# Patient Record
Sex: Female | Born: 1980 | Hispanic: Yes | Marital: Married | Smoking: Former smoker
Health system: Southern US, Community
[De-identification: ages and names within clinical notes are randomized; demographics above are authoritative.]

## PROBLEM LIST (undated history)

## (undated) ENCOUNTER — Inpatient Hospital Stay (HOSPITAL_COMMUNITY): Payer: Self-pay

## (undated) DIAGNOSIS — N96 Recurrent pregnancy loss: Secondary | ICD-10-CM

## (undated) DIAGNOSIS — K760 Fatty (change of) liver, not elsewhere classified: Secondary | ICD-10-CM

## (undated) HISTORY — PX: WISDOM TOOTH EXTRACTION: SHX21

## (undated) HISTORY — PX: DILATION AND CURETTAGE OF UTERUS: SHX78

## (undated) HISTORY — PX: FACIAL FRACTURE SURGERY: SHX1570

---

## 2009-08-24 ENCOUNTER — Ambulatory Visit (HOSPITAL_COMMUNITY): Admission: RE | Admit: 2009-08-24 | Discharge: 2009-08-24 | Payer: Self-pay | Admitting: Family Medicine

## 2010-09-19 ENCOUNTER — Inpatient Hospital Stay (INDEPENDENT_AMBULATORY_CARE_PROVIDER_SITE_OTHER)
Admission: RE | Admit: 2010-09-19 | Discharge: 2010-09-19 | Disposition: A | Discharge status: Home / Self Care | Payer: Self-pay | Source: Ambulatory Visit | Attending: Family Medicine | Admitting: Family Medicine

## 2010-09-19 DIAGNOSIS — O039 Complete or unspecified spontaneous abortion without complication: Secondary | ICD-10-CM

## 2010-09-19 LAB — CBC
MCHC: 34.9 g/dL (ref 30.0–36.0)
Platelets: 270 10*3/uL (ref 150–400)
RDW: 12.4 % (ref 11.5–15.5)

## 2010-09-19 LAB — POCT PREGNANCY, URINE: Preg Test, Ur: POSITIVE

## 2010-09-20 LAB — GC/CHLAMYDIA PROBE AMP, GENITAL
Chlamydia, DNA Probe: NEGATIVE
GC Probe Amp, Genital: NEGATIVE

## 2010-10-06 ENCOUNTER — Ambulatory Visit (INDEPENDENT_AMBULATORY_CARE_PROVIDER_SITE_OTHER): Payer: Self-pay | Admitting: Obstetrics and Gynecology

## 2010-10-06 DIAGNOSIS — O039 Complete or unspecified spontaneous abortion without complication: Secondary | ICD-10-CM

## 2010-10-07 NOTE — Group Therapy Note (Signed)
NAMEIA, LEEB       ACCOUNT NO.:  [id number]  MEDICAL RECORD NO.:  [id number]           PATIENT TYPE:  A  LOCATION:  [facility]                   FACILITY:  [facility]  PHYSICIAN:  Argentina Donovan, MD        DATE OF BIRTH:  1980-09-09  DATE OF SERVICE:  10/06/2010                                 CLINIC NOTE  The patient is a 30 year old gravida 4, para 0-0-4-0 who had 2 abortions in [place] 1 late term at 5 months and 2 spontaneous miscarriages since she has been here.  She recently had a miscarriage, went into the maternity admissions.  They drew a quantitative beta which was 1488 and told her that bleeding would stop soon.  There was no further follow up. She is in today.  We are going to repeat that.  She had a Pap smear in December 2011 and she has not used any contraception for many years because she thought she could not get pregnant.  She is not interested in pregnancy right now.  We have discussed with her by the interpreter on the telephone that the different options.  She has decided to and want to fill out the papers for an IUD.  I told her what we will do is we will run the blood test and we filled out the papers.  We will call her with results of the blood test and if it is not back down to 0, she will call in for repeat.  If it is going up, we will get an ultrasound. If it is completely down, we will bring her in for a Depo-Provera to cover her until we get news about to her IUD.  If she get it from the factory, she will still have to pay for the insertion and she understands that.  IMPRESSION:  Probable complete abortion.          ______________________________ Argentina Donovan, MD    PR/MEDQ  D:  10/06/2010  T:  10/07/2010  Job:  [id number]

## 2010-10-14 ENCOUNTER — Other Ambulatory Visit: Payer: Self-pay

## 2010-10-14 DIAGNOSIS — Z0189 Encounter for other specified special examinations: Secondary | ICD-10-CM

## 2010-10-15 ENCOUNTER — Other Ambulatory Visit: Payer: Self-pay

## 2010-10-21 ENCOUNTER — Other Ambulatory Visit: Payer: Self-pay

## 2010-10-29 ENCOUNTER — Other Ambulatory Visit: Payer: Self-pay

## 2010-10-29 DIAGNOSIS — Z0189 Encounter for other specified special examinations: Secondary | ICD-10-CM

## 2011-11-13 ENCOUNTER — Emergency Department (HOSPITAL_COMMUNITY): Payer: Self-pay

## 2011-11-13 ENCOUNTER — Emergency Department (HOSPITAL_COMMUNITY)
Admission: EM | Admit: 2011-11-13 | Discharge: 2011-11-13 | Disposition: A | Discharge status: Home / Self Care | Payer: Self-pay | Attending: Emergency Medicine | Admitting: Emergency Medicine

## 2011-11-13 ENCOUNTER — Encounter (HOSPITAL_COMMUNITY): Payer: Self-pay | Admitting: *Deleted

## 2011-11-13 DIAGNOSIS — W3400XA Accidental discharge from unspecified firearms or gun, initial encounter: Secondary | ICD-10-CM

## 2011-11-13 DIAGNOSIS — S01409A Unspecified open wound of unspecified cheek and temporomandibular area, initial encounter: Secondary | ICD-10-CM | POA: Insufficient documentation

## 2011-11-13 DIAGNOSIS — Z1889 Other specified retained foreign body fragments: Secondary | ICD-10-CM | POA: Insufficient documentation

## 2011-11-13 DIAGNOSIS — S1190XA Unspecified open wound of unspecified part of neck, initial encounter: Secondary | ICD-10-CM | POA: Insufficient documentation

## 2011-11-13 DIAGNOSIS — S41009A Unspecified open wound of unspecified shoulder, initial encounter: Secondary | ICD-10-CM | POA: Insufficient documentation

## 2011-11-13 LAB — BASIC METABOLIC PANEL
CO2: 22 mEq/L (ref 19–32)
Chloride: 102 mEq/L (ref 96–112)
Creatinine, Ser: 0.55 mg/dL (ref 0.50–1.10)
GFR calc Af Amer: >90 mL/min (ref >90)
GFR calc non Af Amer: >90 mL/min (ref >90)
Potassium: 3.1 mEq/L — ABNORMAL LOW (ref 3.5–5.1)

## 2011-11-13 LAB — DIFFERENTIAL
Lymphocytes Relative: 36 % (ref 12–46)
Monocytes Absolute: 0.6 10*3/uL (ref 0.1–1.0)
Monocytes Relative: 6 % (ref 3–12)
Neutro Abs: 5.4 10*3/uL (ref 1.7–7.7)
Neutrophils Relative %: 55 % (ref 43–77)

## 2011-11-13 LAB — CBC
Hemoglobin: 12.5 g/dL (ref 12.0–15.0)
MCHC: 35 g/dL (ref 30.0–36.0)
MCV: 91.1 fL (ref 78.0–100.0)
Platelets: 205 10*3/uL (ref 150–400)
WBC: 9.8 10*3/uL (ref 4.0–10.5)

## 2011-11-13 LAB — ETHANOL: Alcohol, Ethyl (B): 106 mg/dL — ABNORMAL HIGH (ref 0–11)

## 2011-11-13 MED ORDER — FENTANYL CITRATE 0.05 MG/ML IJ SOLN
100.0000 ug | Freq: Once | INTRAMUSCULAR | Status: AC
  Administered 2011-11-13: 100 ug via INTRAVENOUS
  Filled 2011-11-13: qty 2

## 2011-11-13 MED ORDER — OXYCODONE-ACETAMINOPHEN 5-325 MG PO TABS: 2.0000 | ORAL_TABLET | ORAL | Status: AC | PRN

## 2011-11-13 MED ORDER — LIDOCAINE-EPINEPHRINE (PF) 2 %-1:200000 IJ SOLN
INTRAMUSCULAR | Status: AC
  Administered 2011-11-13: 05:00:00
  Filled 2011-11-13: qty 20

## 2011-11-13 MED ORDER — CEPHALEXIN 500 MG PO CAPS: 500.0000 mg | ORAL_CAPSULE | Freq: Four times a day (QID) | ORAL | Status: AC

## 2011-11-13 MED ORDER — TETANUS-DIPHTH-ACELL PERTUSSIS 5-2.5-18.5 LF-MCG/0.5 IM SUSP
0.5000 mL | Freq: Once | INTRAMUSCULAR | Status: AC
  Administered 2011-11-13: 0.5 mL via INTRAMUSCULAR
  Filled 2011-11-13: qty 0.5
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
## 2011-11-13 NOTE — ED Notes (Signed)
Pt attempting to call someone to come pick her up.

## 2011-11-13 NOTE — ED Notes (Signed)
Warm blanket given x 2 due to patient shivering. Patient nodded her head yes when asked if she was cold.
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
## 2011-11-13 NOTE — ED Notes (Signed)
Patient back from CT.
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
## 2011-11-15 ENCOUNTER — Encounter (HOSPITAL_COMMUNITY): Payer: Self-pay | Admitting: *Deleted

## 2011-11-15 ENCOUNTER — Emergency Department (HOSPITAL_COMMUNITY)
Admission: EM | Admit: 2011-11-15 | Discharge: 2011-11-15 | Disposition: A | Discharge status: Home / Self Care | Payer: Self-pay | Attending: Emergency Medicine | Admitting: Emergency Medicine

## 2011-11-15 ENCOUNTER — Telehealth: Payer: Self-pay | Admitting: Orthopedic Surgery

## 2011-11-15 DIAGNOSIS — Z5189 Encounter for other specified aftercare: Secondary | ICD-10-CM | POA: Insufficient documentation

## 2011-11-15 DIAGNOSIS — Z91012 Allergy to eggs: Secondary | ICD-10-CM | POA: Insufficient documentation

## 2011-11-15 DIAGNOSIS — Z91018 Allergy to other foods: Secondary | ICD-10-CM | POA: Insufficient documentation
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
## 2011-11-15 NOTE — Discharge Instructions (Signed)
Your wounds seem to be progressing nicely. Please see MD at the [facility] in [place] for recheck and follow up. Thanks.Infeccin de heridas  (Wound Infection)  La infeccin ocurre cuando algn tipo de germenbacteriase desarrolla en una herida. Al tratar la infeccin se ayuda a curar la herida. Este tipo de infeccin Insurance underwriter.  CUIDADOS EN EL HOGAR   Slo tome la medicacin segn las indicaciones.   Tome los antibiticos tal como se le indic. Finalice la prescripcin completa, aunque se sienta mejor.   Limpie la herida con jabn suave y [place], segn las indicaciones. Enjuague bien el jabn. Seque bien el rea con una toalla limpia dando golpecitos. No la frote.   Cambie eldressings) tal como se le indic.   Aplique una crema segn le indique el mdico.   Si el vendaje se pega, mjelo con agua jabonosa para despegarlo.   Cmbielo si se moja, se ensucia o huele mal.   Puede tomar [place]. No debe tomar baos de inmersin, nadar ni hacer nada que haga que la herida se encuentre debajo del agua.   Debe evitar los ejercicios fsicos que lo hagan transpirar.   Si la herida le pica, use un medicamento que calme la picazn. No se toque ni se rasque la herida.   Cumpla con los controles mdicos segn las indicaciones.  SOLICITE AYUDA DE INMEDIATO SI:   Aumenta la inflamacin (hinchazn) o el enrojecimiento alrededor de la herida.   Observa una secrecin de color blanco amarillento (pus).   La herida tiene mal olor.   Se abre ms.   Tiene fiebre.  ASEGRESE DE QUE:   Comprende estas instrucciones.   Controlar su enfermedad.   Solicitar ayuda de inmediato si no mejora o si empeora.  Document Released: 01/05/2011 Document Revised: 04/28/2011 [facility] Patient Information 2012 [place], [place].

## 2011-11-15 NOTE — Telephone Encounter (Signed)
Phone number goes to weird recording saying "This number or code is not correct".
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
## 2011-11-16 NOTE — ED Provider Notes (Signed)
Medical screening examination/treatment/procedure(s) were performed by non-physician practitioner and as supervising physician I was immediately available for consultation/collaboration.   Rees Santistevan L Prisca Gearing, MD 11/16/11 0824 

## 2011-12-14 ENCOUNTER — Encounter: Payer: Self-pay | Admitting: Obstetrics & Gynecology

## 2013-01-24 ENCOUNTER — Inpatient Hospital Stay (HOSPITAL_COMMUNITY): Payer: Self-pay

## 2013-01-24 ENCOUNTER — Inpatient Hospital Stay (HOSPITAL_COMMUNITY)
Admission: AD | Admit: 2013-01-24 | Discharge: 2013-01-24 | Disposition: A | Discharge status: Home / Self Care | Payer: Self-pay | Source: Ambulatory Visit | Attending: Obstetrics & Gynecology | Admitting: Obstetrics & Gynecology

## 2013-01-24 ENCOUNTER — Encounter (HOSPITAL_COMMUNITY): Payer: Self-pay | Admitting: *Deleted

## 2013-01-24 DIAGNOSIS — R1900 Intra-abdominal and pelvic swelling, mass and lump, unspecified site: Secondary | ICD-10-CM | POA: Insufficient documentation

## 2013-01-24 DIAGNOSIS — O4690 Antepartum hemorrhage, unspecified, unspecified trimester: Secondary | ICD-10-CM

## 2013-01-24 DIAGNOSIS — R109 Unspecified abdominal pain: Secondary | ICD-10-CM | POA: Insufficient documentation

## 2013-01-24 DIAGNOSIS — O209 Hemorrhage in early pregnancy, unspecified: Secondary | ICD-10-CM

## 2013-01-24 LAB — CBC
HCT: 35.9 % — ABNORMAL LOW (ref 36.0–46.0)
MCH: 32.4 pg (ref 26.0–34.0)
MCV: 94.5 fL (ref 78.0–100.0)
RBC: 3.8 MIL/uL — ABNORMAL LOW (ref 3.87–5.11)
WBC: 9.1 10*3/uL (ref 4.0–10.5)

## 2013-01-24 LAB — URINE MICROSCOPIC-ADD ON

## 2013-01-24 LAB — URINALYSIS, ROUTINE W REFLEX MICROSCOPIC
Glucose, UA: NEGATIVE mg/dL
Leukocytes, UA: NEGATIVE
Specific Gravity, Urine: >1.03 — ABNORMAL HIGH (ref 1.005–1.030)

## 2013-01-24 LAB — WET PREP, GENITAL
Clue Cells Wet Prep HPF POC: NONE SEEN
Trich, Wet Prep: NONE SEEN
WBC, Wet Prep HPF POC: NONE SEEN
Yeast Wet Prep HPF POC: NONE SEEN

## 2013-01-24 LAB — POCT PREGNANCY, URINE: Preg Test, Ur: POSITIVE — AB

## 2013-01-24 NOTE — MAU Note (Signed)
[employer] translator for triage. Patient states she has had a positive home pregnancy test that was positive. Has been having light bleeding for about 5 days, varies from heavy to light. Heavier since yesterday. Slight abdominal pain and pain in the right leg for about since 7-27.

## 2013-01-24 NOTE — MAU Provider Note (Signed)
History     CSN: [id number]  Arrival date and time: 01/24/13 1717   First Provider Initiated Contact with Patient 01/24/13 1904      Chief Complaint  Patient presents with  . Possible Pregnancy  . Abdominal Pain  . Vaginal Bleeding   HPI [name] is a 32 y.o. G4P0030 at [redacted]w[redacted]d who presents to MAU today with complaint of lower abdominal pain since 01/14/13 and vaginal bleeding since Saturday. The patient states that pain is worsening and is rated at 8/10 now. She is also having occasional dysuria and nausea without vomiting or diarrhea. She denies other discharge, fever or dizziness.   OB History   Grav Para Term Preterm Abortions TAB SAB Ect Mult Living   4    3  3    0      Past Medical History  Diagnosis Date  . Medical history non-contributory     Past Surgical History  Procedure Laterality Date  . No past surgeries      No family history on file.  History  Substance Use Topics  . Smoking status: Former Smoker    Quit date: 05/26/2002  . Smokeless tobacco: Not on file  . Alcohol Use: Yes     Comment: occasional Alchol 1 ever 1-2 weeks    Allergies:  Allergies  Allergen Reactions  . Eggs Or Egg-Derived Products Shortness Of Breath  . Pork-Derived Products Shortness Of Breath    No prescriptions prior to admission    Review of Systems  Constitutional: Negative for fever and malaise/fatigue.  Gastrointestinal: Positive for nausea and abdominal pain. Negative for vomiting, diarrhea and constipation.  Genitourinary: Positive for dysuria. Negative for urgency and frequency.       + vaginal bleeding Neg - vaginal discharge  Neurological: Negative for dizziness.   Physical Exam   Blood pressure 115/63, pulse 60, temperature 98 F (36.7 C), temperature source Oral, resp. rate 18, height 4\' 9"  (1.448 m), weight 149 lb (67.586 kg), last menstrual period 12/16/2012, SpO2 100.00%.  Physical Exam  Constitutional: She is oriented to person, place,  and time. She appears well-developed and well-nourished. No distress.  HENT:  Head: Normocephalic and atraumatic.  Cardiovascular: Normal rate, regular rhythm and normal heart sounds.   Respiratory: Effort normal and breath sounds normal. No respiratory distress.  GI: Soft. Bowel sounds are normal. She exhibits no distension and no mass. There is tenderness (mild tenderness to palpatio of the lower abdomen). There is no rebound and no guarding.  Genitourinary: Uterus is enlarged (exam limited by maternal body habitus). Uterus is not tender. Cervix exhibits no motion tenderness, no discharge and no friability. Right adnexum displays tenderness. Right adnexum displays no mass. Left adnexum displays no mass and no tenderness. There is bleeding (small amount of active bleeding in the vaginal vault and at the cervical os) around the vagina. No vaginal discharge found.  Neurological: She is alert and oriented to person, place, and time.  Skin: Skin is warm and dry. No erythema.  Psychiatric: She has a normal mood and affect.   Results for orders placed during the hospital encounter of 01/24/13 (from the past 24 hour(s))  URINALYSIS, ROUTINE W REFLEX MICROSCOPIC     Status: Abnormal   Collection Time    01/24/13  5:55 PM      Result Value Range   Color, Urine YELLOW  YELLOW   APPearance HAZY (*) CLEAR   Specific Gravity, Urine >1.030 (*) 1.005 - 1.030   pH 6.0  5.0 - 8.0   Glucose, UA NEGATIVE  NEGATIVE mg/dL   Hgb urine dipstick LARGE (*) NEGATIVE   Bilirubin Urine NEGATIVE  NEGATIVE   Ketones, ur NEGATIVE  NEGATIVE mg/dL   Protein, ur NEGATIVE  NEGATIVE mg/dL   Urobilinogen, UA 0.2  0.0 - 1.0 mg/dL   Nitrite NEGATIVE  NEGATIVE   Leukocytes, UA NEGATIVE  NEGATIVE  URINE MICROSCOPIC-ADD ON     Status: Abnormal   Collection Time    01/24/13  5:55 PM      Result Value Range   Squamous Epithelial / LPF RARE  RARE   WBC, UA 0-2  <3 WBC/hpf   RBC / HPF 3-6  <3 RBC/hpf   Bacteria, UA FEW (*)  RARE  POCT PREGNANCY, URINE     Status: Abnormal   Collection Time    01/24/13  5:56 PM      Result Value Range   Preg Test, Ur POSITIVE (*) NEGATIVE  CBC     Status: Abnormal   Collection Time    01/24/13  6:30 PM      Result Value Range   WBC 9.1  4.0 - 10.5 K/uL   RBC 3.80 (*) 3.87 - 5.11 MIL/uL   Hemoglobin 12.3  12.0 - 15.0 g/dL   HCT 16.1 (*) 09.6 - 04.5 %   MCV 94.5  78.0 - 100.0 fL   MCH 32.4  26.0 - 34.0 pg   MCHC 34.3  30.0 - 36.0 g/dL   RDW 40.9  81.1 - 91.4 %   Platelets 215  150 - 400 K/uL  ABO/RH     Status: None   Collection Time    01/24/13  6:30 PM      Result Value Range   ABO/RH(D) O POS    HCG, QUANTITATIVE, PREGNANCY     Status: Abnormal   Collection Time    01/24/13  6:30 PM      Result Value Range   hCG, Beta Chain, Quant, S 271 (*) <5 mIU/mL  WET PREP, GENITAL     Status: None   Collection Time    01/24/13  8:40 PM      Result Value Range   Yeast Wet Prep HPF POC NONE SEEN  NONE SEEN   Trich, Wet Prep NONE SEEN  NONE SEEN   Clue Cells Wet Prep HPF POC NONE SEEN  NONE SEEN   WBC, Wet Prep HPF POC NONE SEEN  NONE SEEN    US Ob Comp Less 14 Wks  01/24/2013   *RADIOLOGY REPORT*  Clinical Data: Pregnant, bleeding and right lower quadrant pain  OBSTETRIC <14 WK [place] AND TRANSVAGINAL OB US  Technique:  Both transabdominal and transvaginal ultrasound examinations were performed for complete evaluation of the gestation as well as the maternal uterus, adnexal regions, and pelvic cul-de-sac.  Transvaginal technique was performed to assess early pregnancy.  Comparison:  None.  Intrauterine gestational sac:  No intrauterine gestational sac identified. Yolk sac: None Embryo: None Cardiac Activity: None  Maternal uterus/adnexae: Thickened, heterogeneous endometrium.  The endometrial lining measures up to 2.5 cm in thickness.  The right ovary measures 3.0 x 2.4 x 2.0 cm. There is a nonspecific 2.6 x 2.4 x 3.3 cm heterogeneous structure adjacent to, but separate from the  right ovary. No evidence of hypervascularity associated with this structure on color or powered Doppler analysis.  The left ovary is sonographically unremarkable at 2.5 x 1.3 x 1.8 cm.  The both arterial and venous flow  demonstrated on color Doppler imaging. No free fluid.  IMPRESSION:  1.  A heterogeneous predominately echogenic 2.6 x 2.4 x 3.3 cm structure adjacent to but separate from the right ovary is nonspecific by imaging.  However, in the setting of a positive pregnancy test with no intrauterine gestation and right lower quadrant pain, an ectopic pregnancy is suspected.  2.  Heterogeneous and thickened endometrium measuring up to 2.5 cm.  Critical Value/emergent results were called by telephone at the time of interpretation on 01/24/2013 at 8:00 p.m. to Raynelle Fanning, obstetrical PA, who verbally acknowledged these results.   Original Report Authenticated By: Malachy Moan, M.D.   US Ob Transvaginal  01/24/2013   *RADIOLOGY REPORT*  Clinical Data: Pregnant, bleeding and right lower quadrant pain  OBSTETRIC <14 WK [place] AND TRANSVAGINAL OB US  Technique:  Both transabdominal and transvaginal ultrasound examinations were performed for complete evaluation of the gestation as well as the maternal uterus, adnexal regions, and pelvic cul-de-sac.  Transvaginal technique was performed to assess early pregnancy.  Comparison:  None.  Intrauterine gestational sac:  No intrauterine gestational sac identified. Yolk sac: None Embryo: None Cardiac Activity: None  Maternal uterus/adnexae: Thickened, heterogeneous endometrium.  The endometrial lining measures up to 2.5 cm in thickness.  The right ovary measures 3.0 x 2.4 x 2.0 cm. There is a nonspecific 2.6 x 2.4 x 3.3 cm heterogeneous structure adjacent to, but separate from the right ovary. No evidence of hypervascularity associated with this structure on color or powered Doppler analysis.  The left ovary is sonographically unremarkable at 2.5 x 1.3 x 1.8 cm.  The both arterial  and venous flow demonstrated on color Doppler imaging. No free fluid.  IMPRESSION:  1.  A heterogeneous predominately echogenic 2.6 x 2.4 x 3.3 cm structure adjacent to but separate from the right ovary is nonspecific by imaging.  However, in the setting of a positive pregnancy test with no intrauterine gestation and right lower quadrant pain, an ectopic pregnancy is suspected.  2.  Heterogeneous and thickened endometrium measuring up to 2.5 cm.  Critical Value/emergent results were called by telephone at the time of interpretation on 01/24/2013 at 8:00 p.m. to Raynelle Fanning, obstetrical PA, who verbally acknowledged these results.   Original Report Authenticated By: Malachy Moan, M.D.     MAU Course  Procedures None  MDM Discussed patient labs and [place] results with Dr. Debroah Loop. Patient should return to MAU in 48 hours for follow-up quant hCG  Assessment and Plan  A: Vaginal bleeding in pregnancy, first trimester Pelvic mass suspicious for ectopic  P: Discharge home Ectopic/bleeding precautions discussed Patient advised to return to MAU in 48 hours for labs or sooner if symptoms worsen  Freddi Starr, PA-C  01/24/2013, 9:28 PM

## 2013-01-25 LAB — GC/CHLAMYDIA PROBE AMP
CT Probe RNA: NEGATIVE
GC Probe RNA: NEGATIVE

## 2013-01-26 ENCOUNTER — Inpatient Hospital Stay (HOSPITAL_COMMUNITY): Payer: Self-pay

## 2013-01-26 ENCOUNTER — Encounter (HOSPITAL_COMMUNITY): Payer: Self-pay | Admitting: *Deleted

## 2013-01-26 ENCOUNTER — Inpatient Hospital Stay (HOSPITAL_COMMUNITY)
Admission: AD | Admit: 2013-01-26 | Discharge: 2013-01-26 | Disposition: A | Discharge status: Home / Self Care | Payer: Self-pay | Source: Ambulatory Visit | Attending: Family Medicine | Admitting: Family Medicine

## 2013-01-26 DIAGNOSIS — K7689 Other specified diseases of liver: Secondary | ICD-10-CM | POA: Insufficient documentation

## 2013-01-26 DIAGNOSIS — N9489 Other specified conditions associated with female genital organs and menstrual cycle: Secondary | ICD-10-CM | POA: Insufficient documentation

## 2013-01-26 DIAGNOSIS — R109 Unspecified abdominal pain: Secondary | ICD-10-CM | POA: Insufficient documentation

## 2013-01-26 DIAGNOSIS — O00109 Unspecified tubal pregnancy without intrauterine pregnancy: Secondary | ICD-10-CM | POA: Insufficient documentation

## 2013-01-26 LAB — AST: AST: 127 U/L — ABNORMAL HIGH (ref 0–37)

## 2013-01-26 LAB — CBC
MCH: 32.3 pg (ref 26.0–34.0)
MCHC: 34.3 g/dL (ref 30.0–36.0)
Platelets: 211 10*3/uL (ref 150–400)
RDW: 12.8 % (ref 11.5–15.5)

## 2013-01-26 LAB — CREATININE, SERUM: GFR calc non Af Amer: >90 mL/min (ref >90)

## 2013-01-26 LAB — HCG, QUANTITATIVE, PREGNANCY: hCG, Beta Chain, Quant, S: 106 m[IU]/mL — ABNORMAL HIGH (ref <5)

## 2013-01-26 MED ORDER — KETOROLAC TROMETHAMINE 60 MG/2ML IM SOLN
60.0000 mg | Freq: Once | INTRAMUSCULAR | Status: AC
  Administered 2013-01-26: 60 mg via INTRAMUSCULAR
  Filled 2013-01-26: qty 2

## 2013-01-26 MED ORDER — OXYCODONE-ACETAMINOPHEN 5-325 MG PO TABS: 1.0000 | ORAL_TABLET | ORAL | Status: DC | PRN

## 2013-01-26 MED ORDER — METHOTREXATE INJECTION FOR WOMEN'S HOSPITAL
50.0000 mg/m2 | Freq: Once | INTRAMUSCULAR | Status: AC
  Administered 2013-01-26: 80 mg via INTRAMUSCULAR
  Filled 2013-01-26: qty 1.6

## 2013-01-26 NOTE — MAU Provider Note (Signed)
Chart reviewed and agree with management and plan.  

## 2013-01-26 NOTE — MAU Provider Note (Signed)
History     CSN: [id number]  Arrival date and time: 01/26/13 1342   First Provider Initiated Contact with Patient 01/26/13 1436     Chief complaint: abdominal pain   HPI  [name] is a 32 y.o. female G4P0030 at [redacted]w[redacted]d who presents to MAU for a follow up beta hcg. She was here on 01/24/13 for bleeding and abdominal pain; sent home with ectopic precautions. Today, her pain is worse and her vaginal bleeding has increased. She describes the pain as sharp, cramp like, in her lower/ mid abdomen. Pt was seen in [place] 2 months ago and was told she has "fatty liver disease".  Beta Quant 9/4: 271, Beta Quant 9/6: 106  OB History   Grav Para Term Preterm Abortions TAB SAB Ect Mult Living   4    3  3    0      Past Medical History  Diagnosis Date  . Medical history non-contributory     Past Surgical History  Procedure Laterality Date  . No past surgeries      No family history on file.  History  Substance Use Topics  . Smoking status: Former Smoker    Quit date: 05/26/2002  . Smokeless tobacco: Not on file  . Alcohol Use: Yes     Comment: occasional Alchol 1 ever 1-2 weeks    Allergies:  Allergies  Allergen Reactions  . Eggs Or Egg-Derived Products Shortness Of Breath  . Pork-Derived Products Shortness Of Breath    Prescriptions prior to admission  Medication Sig Dispense Refill  . acetaminophen (TYLENOL) 500 MG tablet Take 1,000 mg by mouth every 6 (six) hours as needed for pain.      Marland Kitchen ibuprofen (ADVIL,MOTRIN) 200 MG tablet Take 200 mg by mouth every 6 (six) hours as needed for pain.       Results for orders placed during the hospital encounter of 01/26/13 (from the past 24 hour(s))  AST     Status: Abnormal   Collection Time    01/26/13  2:25 PM      Result Value Range   AST 127 (*) 0 - 37 U/L  BUN     Status: None   Collection Time    01/26/13  2:25 PM      Result Value Range   BUN 11  6 - 23 mg/dL  CREATININE, SERUM     Status: None    Collection Time    01/26/13  2:25 PM      Result Value Range   Creatinine, Ser 0.56  0.50 - 1.10 mg/dL   GFR calc non Af Amer >90  >90 mL/min   GFR calc Af Amer >90  >90 mL/min  CBC     Status: Abnormal   Collection Time    01/26/13  2:31 PM      Result Value Range   WBC 11.2 (*) 4.0 - 10.5 K/uL   RBC 4.00  3.87 - 5.11 MIL/uL   Hemoglobin 12.9  12.0 - 15.0 g/dL   HCT 40.9  81.1 - 91.4 %   MCV 94.0  78.0 - 100.0 fL   MCH 32.3  26.0 - 34.0 pg   MCHC 34.3  30.0 - 36.0 g/dL   RDW 78.2  95.6 - 21.3 %   Platelets 211  150 - 400 K/uL  HCG, QUANTITATIVE, PREGNANCY     Status: Abnormal   Collection Time    01/26/13  2:31 PM  Result Value Range   hCG, Beta Chain, Quant, S 106 (*) <5 mIU/mL   US Ob Comp Less 14 Wks  01/24/2013   *RADIOLOGY REPORT*  Clinical Data: Pregnant, bleeding and right lower quadrant pain  OBSTETRIC <14 WK [place] AND TRANSVAGINAL OB US  Technique:  Both transabdominal and transvaginal ultrasound examinations were performed for complete evaluation of the gestation as well as the maternal uterus, adnexal regions, and pelvic cul-de-sac.  Transvaginal technique was performed to assess early pregnancy.  Comparison:  None.  Intrauterine gestational sac:  No intrauterine gestational sac identified. Yolk sac: None Embryo: None Cardiac Activity: None  Maternal uterus/adnexae: Thickened, heterogeneous endometrium.  The endometrial lining measures up to 2.5 cm in thickness.  The right ovary measures 3.0 x 2.4 x 2.0 cm. There is a nonspecific 2.6 x 2.4 x 3.3 cm heterogeneous structure adjacent to, but separate from the right ovary. No evidence of hypervascularity associated with this structure on color or powered Doppler analysis.  The left ovary is sonographically unremarkable at 2.5 x 1.3 x 1.8 cm.  The both arterial and venous flow demonstrated on color Doppler imaging. No free fluid.  IMPRESSION:  1.  A heterogeneous predominately echogenic 2.6 x 2.4 x 3.3 cm structure adjacent to but  separate from the right ovary is nonspecific by imaging.  However, in the setting of a positive pregnancy test with no intrauterine gestation and right lower quadrant pain, an ectopic pregnancy is suspected.  2.  Heterogeneous and thickened endometrium measuring up to 2.5 cm.  Critical Value/emergent results were called by telephone at the time of interpretation on 01/24/2013 at 8:00 p.m. to Raynelle Fanning, obstetrical PA, who verbally acknowledged these results.   Original Report Authenticated By: Malachy Moan, M.D.   US Ob Transvaginal  01/26/2013   *RADIOLOGY REPORT*  Clinical Data: Suspicion of right-sided ectopic pregnancy.  Pain and bleeding.  Beta HCG of 106 today and 271 2 days ago.  TRANSVAGINAL OB ULTRASOUND  Technique:  Transvaginal ultrasound was performed for evaluation of the gestation as well as the maternal uterus and adnexal regions.  Comparison: 01/24/2013  Findings: No evidence intrauterine gestational sac.  Normal appearance of the uterus for age.  Separate from an otherwise normal-appearing right ovary is a right adnexal 3.5 x 2.5 x 2.4 cm heterogeneous "mass."  Left ovary is normal in appearance.  No significant free fluid.  IMPRESSION: Findings which are again suspicious for right-sided ectopic pregnancy.  Similar size of a right adnexal, extraovarian "mass". Normal appearance of the uterus, without evidence of intrauterine gestational sac.  No evidence of rupture.  Critical test results telephoned to [facility], r.n. at the time of interpretation at 3:29 p.m. on 01/26/2013.   Original Report Authenticated By: Jeronimo Greaves, M.D.   US Ob Transvaginal  01/24/2013   *RADIOLOGY REPORT*  Clinical Data: Pregnant, bleeding and right lower quadrant pain  OBSTETRIC <14 WK [place] AND TRANSVAGINAL OB US  Technique:  Both transabdominal and transvaginal ultrasound examinations were performed for complete evaluation of the gestation as well as the maternal uterus, adnexal regions, and pelvic cul-de-sac.   Transvaginal technique was performed to assess early pregnancy.  Comparison:  None.  Intrauterine gestational sac:  No intrauterine gestational sac identified. Yolk sac: None Embryo: None Cardiac Activity: None  Maternal uterus/adnexae: Thickened, heterogeneous endometrium.  The endometrial lining measures up to 2.5 cm in thickness.  The right ovary measures 3.0 x 2.4 x 2.0 cm. There is a nonspecific 2.6 x 2.4 x 3.3 cm heterogeneous structure  adjacent to, but separate from the right ovary. No evidence of hypervascularity associated with this structure on color or powered Doppler analysis.  The left ovary is sonographically unremarkable at 2.5 x 1.3 x 1.8 cm.  The both arterial and venous flow demonstrated on color Doppler imaging. No free fluid.  IMPRESSION:  1.  A heterogeneous predominately echogenic 2.6 x 2.4 x 3.3 cm structure adjacent to but separate from the right ovary is nonspecific by imaging.  However, in the setting of a positive pregnancy test with no intrauterine gestation and right lower quadrant pain, an ectopic pregnancy is suspected.  2.  Heterogeneous and thickened endometrium measuring up to 2.5 cm.  Critical Value/emergent results were called by telephone at the time of interpretation on 01/24/2013 at 8:00 p.m. to Raynelle Fanning, obstetrical PA, who verbally acknowledged these results.   Original Report Authenticated By: Malachy Moan, M.D.     Review of Systems  Constitutional: Positive for diaphoresis. Negative for fever and chills.  Gastrointestinal: Positive for abdominal pain. Negative for nausea and vomiting.       Lower, Mid abdominal pain. Sharp and cramp like pain   Genitourinary:       No vaginal discharge. Vaginal bleeding has increased; bright red, has changed 1-2 pads today. No dysuria.   Neurological: Positive for dizziness.   Physical Exam   Blood pressure 133/64, pulse 62, temperature 98 F (36.7 C), temperature source Oral, resp. rate 18, height 4' 8.4" (1.433 m),  weight 66.134 kg (145 lb 12.8 oz), last menstrual period 12/16/2012.  Physical Exam  Constitutional: She is oriented to person, place, and time. She appears well-developed and well-nourished. No distress.  Neck: Neck supple.  GI: Soft. She exhibits no distension. There is tenderness. There is guarding. There is no rebound.  Suprapubic tenderness   Neurological: She is alert and oriented to person, place, and time.  Skin: Skin is warm. She is not diaphoretic.  Psychiatric: Her behavior is normal.  Pt is tearful     MAU Course  Procedures  MDM Beta Hcg CBC  [place] AST BUN Creatinine   Toradol 60 mg IM times 1 dose Notified Dr. Shawnie Pons that patient returned to MAU with increased pain and bleeding @ 1450.  O positive blood type  Consulted with Dr. Shawnie Pons regarding right ectopic pregnancy; plan to give methotrexate. Pt has elevated AST level and fatty liver disease, risk vs benefit discussed with Dr. Shawnie Pons  Assessment and Plan  A: Right adnexal mass; suspicious for right ectopic pregnancy treated with methotrexate   P:  Discharge home Methotrexate 80 mg IM X 1 dose; given in MAU Return to MAU Tuesday 01/29/2013 for follow up beta hcg Bleeding precautions discussed Ectopic precautions discussed  RX: Percocet 5/325 mg PO Q4-6 hours as needed for pain (#10) no RF Return to MAU if pain worsens Support given     [facility], JENNIFER IRENE 01/26/2013, 6:43 PM

## 2013-01-26 NOTE — MAU Note (Signed)
Pain and vaginal bleeding have increased since Thursday. Told to come back Saturday for repeat BHCG.

## 2013-01-29 ENCOUNTER — Inpatient Hospital Stay (HOSPITAL_COMMUNITY)
Admission: AD | Admit: 2013-01-29 | Discharge: 2013-01-29 | Disposition: A | Discharge status: Home / Self Care | Payer: Self-pay | Source: Ambulatory Visit | Attending: Family Medicine | Admitting: Family Medicine

## 2013-01-29 DIAGNOSIS — O009 Unspecified ectopic pregnancy without intrauterine pregnancy: Secondary | ICD-10-CM

## 2013-01-29 DIAGNOSIS — O00109 Unspecified tubal pregnancy without intrauterine pregnancy: Secondary | ICD-10-CM | POA: Insufficient documentation

## 2013-01-29 MED ORDER — PROMETHAZINE HCL 25 MG PO TABS: 25.0000 mg | ORAL_TABLET | Freq: Four times a day (QID) | ORAL | Status: DC | PRN

## 2013-01-29 NOTE — MAU Provider Note (Signed)
Chart reviewed and agree with management and plan.  

## 2013-01-29 NOTE — MAU Note (Signed)
Patient to MAU for BHCG day 4 s/p MTX. Hospital translator in for triage. Patient denies pain but does continues to have some pinkish bleeding.

## 2013-01-29 NOTE — MAU Note (Signed)
Has had vomiting since 9-7 and has been unable to keep anything down.

## 2013-01-29 NOTE — MAU Note (Signed)
Questions answered with spanish interpreter at bedside

## 2013-01-29 NOTE — MAU Note (Signed)
Patient states she has had no vomiting today.

## 2013-01-29 NOTE — MAU Provider Note (Signed)
HPI:  [name] is a 32 y.o. female; G4P0030 at [redacted]w[redacted]d who presents to MAU for a follow up Beta Hcg following methotrexate administration for a right ectopic pregnancy. Quant day 0/1: 106, Quant day 4: 24. She is feeling ok, has pain all over at times; pain is not any worse than her last visit. Very minimal bleeding; less than what she was experiencing at her last visit. She tried taking percocet but it made her sick, she would like to take ibuprofen instead and would like something for nausea.   Objective:  Filed Vitals:   01/29/13 1827  BP: 127/70  Pulse: 60  Temp: 98.2 F (36.8 C)  TempSrc: Oral  Resp: 16  SpO2: 100%    GENERAL: Well-developed, well-nourished female in no acute distress.  HEENT: Normocephalic, atraumatic.   LUNGS: Effort normal HEART: Regular rate  SKIN: Warm, dry and without erythema PSYCH: Normal mood and affect   Results for orders placed during the hospital encounter of 01/29/13 (from the past 24 hour(s))  HCG, QUANTITATIVE, PREGNANCY     Status: Abnormal   Collection Time    01/29/13  6:13 PM      Result Value Range   hCG, Beta Chain, Quant, S 24 (*) <5 mIU/mL    A: Follow up methotrexate for right ectopic pregnancy   P: Discharge home RX: phenergan PO Return Friday 02/01/2013 for follow up Hcg level.  Return to MAU for worsening symptoms Support given   Iona Hansen Nahiara Kretzschmar, NP 01/29/2013 8:09 PM

## 2013-02-01 ENCOUNTER — Inpatient Hospital Stay (HOSPITAL_COMMUNITY)
Admission: AD | Admit: 2013-02-01 | Discharge: 2013-02-01 | Disposition: A | Discharge status: Home / Self Care | Payer: Self-pay | Source: Ambulatory Visit | Attending: Obstetrics & Gynecology | Admitting: Obstetrics & Gynecology

## 2013-02-01 DIAGNOSIS — O009 Unspecified ectopic pregnancy without intrauterine pregnancy: Secondary | ICD-10-CM

## 2013-02-01 DIAGNOSIS — O00109 Unspecified tubal pregnancy without intrauterine pregnancy: Secondary | ICD-10-CM | POA: Insufficient documentation

## 2013-02-01 LAB — URINALYSIS, ROUTINE W REFLEX MICROSCOPIC
Ketones, ur: NEGATIVE mg/dL
Leukocytes, UA: NEGATIVE
Nitrite: NEGATIVE
Specific Gravity, Urine: 1.015 (ref 1.005–1.030)
pH: 6 (ref 5.0–8.0)

## 2013-02-01 LAB — URINE MICROSCOPIC-ADD ON

## 2013-02-01 LAB — HCG, QUANTITATIVE, PREGNANCY: hCG, Beta Chain, Quant, S: 7 m[IU]/mL — ABNORMAL HIGH (ref <5)

## 2013-02-01 NOTE — MAU Note (Signed)
Sharen Counter CNM aware of pt's triage assessment and will be in lobby awaiting BHCG results. Aware pt has had dizziness and h/a today but no other complaints. U/A ordered and sent

## 2013-02-01 NOTE — MAU Note (Signed)
Sharen Counter CNM talked with pt in Triage regarding BHCG results and d/c plan. Maday, Spanish interpreter, helped with instructions. Pt d/c to home from Triage

## 2013-02-01 NOTE — MAU Provider Note (Signed)
S: 32 y.o. G4P0030 @[redacted]w[redacted]d  by LMP presents for Day 7 F/U after methotrexate therapy.  She reports a mild h/a, but denies vaginal bleeding, abdominal pain, dizziness, n/v, or fever/chills.    O: BP 132/59  Pulse 64  Temp(Src) 98.3 F (36.8 C)  Resp 18  Ht 4' 9.5" (1.461 m)  Wt 66.134 kg (145 lb 12.8 oz)  BMI 30.98 kg/m2  SpO2 100%  LMP 12/16/2012  Results for orders placed during the hospital encounter of 02/01/13 (from the past 24 hour(s))  URINALYSIS, ROUTINE W REFLEX MICROSCOPIC     Status: Abnormal   Collection Time    02/01/13  9:04 PM      Result Value Range   Color, Urine YELLOW  YELLOW   APPearance CLEAR  CLEAR   Specific Gravity, Urine 1.015  1.005 - 1.030   pH 6.0  5.0 - 8.0   Glucose, UA NEGATIVE  NEGATIVE mg/dL   Hgb urine dipstick MODERATE (*) NEGATIVE   Bilirubin Urine NEGATIVE  NEGATIVE   Ketones, ur NEGATIVE  NEGATIVE mg/dL   Protein, ur NEGATIVE  NEGATIVE mg/dL   Urobilinogen, UA 0.2  0.0 - 1.0 mg/dL   Nitrite NEGATIVE  NEGATIVE   Leukocytes, UA NEGATIVE  NEGATIVE  URINE MICROSCOPIC-ADD ON     Status: None   Collection Time    02/01/13  9:04 PM      Result Value Range   Squamous Epithelial / LPF RARE  RARE   WBC, UA 0-2  <3 WBC/hpf   RBC / HPF 0-2  <3 RBC/hpf  HCG, QUANTITATIVE, PREGNANCY     Status: Abnormal   Collection Time    02/01/13  9:05 PM      Result Value Range   hCG, Beta Chain, Quant, S 7 (*) <5 mIU/mL    A:  1. Ectopic pregnancy    P: Consult Dr Despina Hidden No follow up needed.  Methotrexate therapy successful Reviewed lab finding with pt using hospital Spanish translator Encouraged pt to drink more fluids and take Tylenol for h/a. Pt has appointment with fertility clinic in [place] Return to MAU as needed  Sharen Counter Certified Nurse-Midwife

## 2013-02-01 NOTE — MAU Note (Signed)
Was seen here Tues and has returned for repeat blood work. Some dizziness today and headache.

## 2013-11-29 ENCOUNTER — Encounter (HOSPITAL_COMMUNITY): Payer: Self-pay | Admitting: *Deleted

## 2013-12-26 ENCOUNTER — Inpatient Hospital Stay (HOSPITAL_COMMUNITY)
Admission: AD | Admit: 2013-12-26 | Discharge: 2013-12-26 | Discharge status: Against Medical Advice | Payer: Self-pay | Source: Ambulatory Visit | Attending: Obstetrics & Gynecology | Admitting: Obstetrics & Gynecology

## 2013-12-26 ENCOUNTER — Encounter (HOSPITAL_COMMUNITY): Payer: Self-pay | Admitting: Advanced Practice Midwife

## 2013-12-26 DIAGNOSIS — Z3201 Encounter for pregnancy test, result positive: Secondary | ICD-10-CM | POA: Insufficient documentation

## 2013-12-26 DIAGNOSIS — M549 Dorsalgia, unspecified: Secondary | ICD-10-CM | POA: Insufficient documentation

## 2013-12-26 DIAGNOSIS — R109 Unspecified abdominal pain: Secondary | ICD-10-CM | POA: Insufficient documentation

## 2013-12-26 DIAGNOSIS — S0183XA Puncture wound without foreign body of other part of head, initial encounter: Secondary | ICD-10-CM

## 2013-12-26 DIAGNOSIS — O2620 Pregnancy care for patient with recurrent pregnancy loss, unspecified trimester: Secondary | ICD-10-CM | POA: Diagnosis present

## 2013-12-26 DIAGNOSIS — W3400XA Accidental discharge from unspecified firearms or gun, initial encounter: Secondary | ICD-10-CM

## 2013-12-26 DIAGNOSIS — O0910 Supervision of pregnancy with history of ectopic or molar pregnancy, unspecified trimester: Secondary | ICD-10-CM | POA: Diagnosis present

## 2013-12-26 LAB — POCT PREGNANCY, URINE: PREG TEST UR: POSITIVE — AB

## 2013-12-26 LAB — URINALYSIS, ROUTINE W REFLEX MICROSCOPIC
Bilirubin Urine: NEGATIVE
GLUCOSE, UA: NEGATIVE mg/dL
Hgb urine dipstick: NEGATIVE
Ketones, ur: 40 mg/dL — AB
LEUKOCYTES UA: NEGATIVE
Nitrite: NEGATIVE
PH: 5.5 (ref 5.0–8.0)
Protein, ur: NEGATIVE mg/dL
SPECIFIC GRAVITY, URINE: 1.025 (ref 1.005–1.030)
Urobilinogen, UA: 0.2 mg/dL (ref 0.0–1.0)

## 2013-12-26 LAB — CBC
HEMATOCRIT: 38.4 % (ref 36.0–46.0)
HEMOGLOBIN: 13.4 g/dL (ref 12.0–15.0)
MCH: 33.1 pg (ref 26.0–34.0)
MCHC: 34.9 g/dL (ref 30.0–36.0)
MCV: 94.8 fL (ref 78.0–100.0)
Platelets: 234 10*3/uL (ref 150–400)
RBC: 4.05 MIL/uL (ref 3.87–5.11)
RDW: 13.1 % (ref 11.5–15.5)
WBC: 8.7 10*3/uL (ref 4.0–10.5)

## 2013-12-26 LAB — HCG, QUANTITATIVE, PREGNANCY: hCG, Beta Chain, Quant, S: 274 m[IU]/mL — ABNORMAL HIGH (ref <5)

## 2013-12-26 NOTE — MAU Note (Signed)
Patient states she has had a positive home pregnancy test. Has had abdominal and back pain for about one week. Denies bleeding, nausea, vomiting or discharge.

## 2013-12-26 NOTE — MAU Note (Signed)
Patient is not in the lobby when called to a room. Third call.

## 2013-12-26 NOTE — MAU Note (Addendum)
Called pt to bring back to room 6.  Pt not in waiting area.  1530 called pt second time.  Not in lobby area.

## 2014-01-04 ENCOUNTER — Inpatient Hospital Stay (HOSPITAL_COMMUNITY)
Admission: AD | Admit: 2014-01-04 | Discharge: 2014-01-04 | Disposition: A | Discharge status: Home / Self Care | Payer: Self-pay | Source: Ambulatory Visit | Attending: Obstetrics and Gynecology | Admitting: Obstetrics and Gynecology

## 2014-01-04 ENCOUNTER — Inpatient Hospital Stay (HOSPITAL_COMMUNITY): Payer: Self-pay

## 2014-01-04 ENCOUNTER — Encounter (HOSPITAL_COMMUNITY): Payer: Self-pay

## 2014-01-04 DIAGNOSIS — O99891 Other specified diseases and conditions complicating pregnancy: Secondary | ICD-10-CM | POA: Insufficient documentation

## 2014-01-04 DIAGNOSIS — O26899 Other specified pregnancy related conditions, unspecified trimester: Secondary | ICD-10-CM

## 2014-01-04 DIAGNOSIS — O9989 Other specified diseases and conditions complicating pregnancy, childbirth and the puerperium: Principal | ICD-10-CM

## 2014-01-04 DIAGNOSIS — R109 Unspecified abdominal pain: Secondary | ICD-10-CM | POA: Insufficient documentation

## 2014-01-04 DIAGNOSIS — Z87891 Personal history of nicotine dependence: Secondary | ICD-10-CM | POA: Insufficient documentation

## 2014-01-04 HISTORY — DX: Recurrent pregnancy loss: N96

## 2014-01-04 LAB — CBC
HEMATOCRIT: 39.5 % (ref 36.0–46.0)
HEMOGLOBIN: 13.6 g/dL (ref 12.0–15.0)
MCH: 32.2 pg (ref 26.0–34.0)
MCHC: 34.4 g/dL (ref 30.0–36.0)
MCV: 93.4 fL (ref 78.0–100.0)
Platelets: 226 10*3/uL (ref 150–400)
RBC: 4.23 MIL/uL (ref 3.87–5.11)
RDW: 12.9 % (ref 11.5–15.5)
WBC: 9.3 10*3/uL (ref 4.0–10.5)

## 2014-01-04 LAB — COMPREHENSIVE METABOLIC PANEL
ALK PHOS: 59 U/L (ref 39–117)
ALT: 184 U/L — AB (ref 0–35)
AST: 112 U/L — AB (ref 0–37)
Albumin: 3.8 g/dL (ref 3.5–5.2)
Anion gap: 12 (ref 5–15)
BUN: 9 mg/dL (ref 6–23)
CO2: 25 meq/L (ref 19–32)
Calcium: 9.6 mg/dL (ref 8.4–10.5)
Chloride: 102 mEq/L (ref 96–112)
Creatinine, Ser: 0.57 mg/dL (ref 0.50–1.10)
GLUCOSE: 100 mg/dL — AB (ref 70–99)
POTASSIUM: 4 meq/L (ref 3.7–5.3)
SODIUM: 139 meq/L (ref 137–147)
Total Bilirubin: 0.4 mg/dL (ref 0.3–1.2)
Total Protein: 6.7 g/dL (ref 6.0–8.3)

## 2014-01-04 LAB — WET PREP, GENITAL
CLUE CELLS WET PREP: NONE SEEN
Trich, Wet Prep: NONE SEEN
WBC, Wet Prep HPF POC: NONE SEEN
Yeast Wet Prep HPF POC: NONE SEEN

## 2014-01-04 LAB — HCG, QUANTITATIVE, PREGNANCY: HCG, BETA CHAIN, QUANT, S: 43 m[IU]/mL — AB (ref <5)

## 2014-01-04 MED ORDER — GI COCKTAIL ~~LOC~~
30.0000 mL | Freq: Once | ORAL | Status: AC
  Administered 2014-01-04: 30 mL via ORAL
  Filled 2014-01-04: qty 30

## 2014-01-04 MED ORDER — KETOROLAC TROMETHAMINE 60 MG/2ML IM SOLN
60.0000 mg | Freq: Once | INTRAMUSCULAR | Status: AC
  Administered 2014-01-04: 60 mg via INTRAMUSCULAR
  Filled 2014-01-04: qty 2

## 2014-01-04 MED ORDER — DOCUSATE SODIUM 100 MG PO CAPS: 100.0000 mg | ORAL_CAPSULE | Freq: Two times a day (BID) | ORAL | Status: DC

## 2014-01-04 NOTE — MAU Note (Signed)
Pt states via Shanda BumpsJessica, [place] translator that pt was seen in [place] last Thursday, told pregnancy too early to tell if is ectopic.  Yesterday pain in back, legs, then lower abdomen. This am noted bright red blood only when wiping. Was having burning and urgency with voiding, though none present now.

## 2014-01-04 NOTE — MAU Provider Note (Signed)
History     CSN: [id number]  Arrival date and time: 01/04/14 1322   None     Chief Complaint  Patient presents with  . Vaginal Bleeding  . Abdominal Pain  . Positive UPT    HPI Pt is G5P0040 with hx of SAB x1 and Ectopic with MTX x 3.  Pt has been having abdominal pain and cramping since LMP but got worse last night. Pt states that her pain is diffuse lower abdominal pain.  Pt does not remember which side her ectopic was on.   Pain radiates down her right hip.   RN note: Pt states via Shanda Bumps, [place] translator that pt was seen in [place] last Thursday, told pregnancy too early to tell if is ectopic.  Yesterday pain in back, legs, then lower abdomen. This am noted bright red blood only when wiping. Was having burning and urgency with voiding, though none present now.       Past Medical History  Diagnosis Date  . Medical history non-contributory     Past Surgical History  Procedure Laterality Date  . No past surgeries      No family history on file.  History  Substance Use Topics  . Smoking status: Former Smoker    Quit date: 05/26/2002  . Smokeless tobacco: Not on file  . Alcohol Use: Yes     Comment: occasional Alchol 1 ever 1-2 weeks    Allergies:  Allergies  Allergen Reactions  . Eggs Or Egg-Derived Products Shortness Of Breath  . Pork-Derived Products Shortness Of Breath    Prescriptions prior to admission  Medication Sig Dispense Refill  . acetaminophen (TYLENOL) 500 MG tablet Take 1,000 mg by mouth every 6 (six) hours as needed for pain.      Marland Kitchen ibuprofen (ADVIL,MOTRIN) 200 MG tablet Take 200 mg by mouth every 6 (six) hours as needed for pain.      Marland Kitchen oxyCODONE-acetaminophen (PERCOCET/ROXICET) 5-325 MG per tablet Take 1-2 tablets by mouth every 4 (four) hours as needed for pain.  10 tablet  0  . promethazine (PHENERGAN) 25 MG tablet Take 1 tablet (25 mg total) by mouth every 6 (six) hours as needed for nausea.  20 tablet  0    Review of Systems    Constitutional: Negative for fever and chills.  Gastrointestinal: Positive for nausea, abdominal pain and constipation. Negative for vomiting and diarrhea.  Genitourinary: Negative for dysuria and urgency.   Physical Exam   Blood pressure 107/52, pulse 53, temperature 97.7 F (36.5 C), temperature source Oral, resp. rate 18, last menstrual period 11/22/2013, unknown if currently breastfeeding.  Physical Exam  Nursing note and vitals reviewed. Constitutional: She is oriented to person, place, and time. She appears well-developed and well-nourished. No distress.  HENT:  Head: Normocephalic.  Eyes: Pupils are equal, round, and reactive to light.  Neck: Normal range of motion. Neck supple.  Cardiovascular: Normal rate.   Respiratory: Effort normal.  GI: Soft.  Diffuse tenderness  Genitourinary:  Mod amount of bright red blood in vault; cervix closed, NT; uterus NSSC NT; adnexa. without palpable enlargement No rebound  Musculoskeletal: Normal range of motion.  Neurological: She is alert and oriented to person, place, and time.  Skin: Skin is warm and dry.  Psychiatric: She has a normal mood and affect.    MAU Course  Procedures Results for orders placed during the hospital encounter of 01/04/14 (from the past 24 hour(s))  CBC     Status: None   Collection  Time    01/04/14  1:57 PM      Result Value Ref Range   WBC 9.3  4.0 - 10.5 K/uL   RBC 4.23  3.87 - 5.11 MIL/uL   Hemoglobin 13.6  12.0 - 15.0 g/dL   HCT 86.539.5  78.436.0 - 69.646.0 %   MCV 93.4  78.0 - 100.0 fL   MCH 32.2  26.0 - 34.0 pg   MCHC 34.4  30.0 - 36.0 g/dL   RDW 29.512.9  28.411.5 - 13.215.5 %   Platelets 226  150 - 400 K/uL  HCG, QUANTITATIVE, PREGNANCY     Status: Abnormal   Collection Time    01/04/14  1:57 PM      Result Value Ref Range   hCG, Beta Chain, Quant, S 43 (*) <5 mIU/mL  COMPREHENSIVE METABOLIC PANEL     Status: Abnormal   Collection Time    01/04/14  1:57 PM      Result Value Ref Range   Sodium 139  137 - 147  mEq/L   Potassium 4.0  3.7 - 5.3 mEq/L   Chloride 102  96 - 112 mEq/L   CO2 25  19 - 32 mEq/L   Glucose, Bld 100 (*) 70 - 99 mg/dL   BUN 9  6 - 23 mg/dL   Creatinine, Ser 4.400.57  0.50 - 1.10 mg/dL   Calcium 9.6  8.4 - 10.210.5 mg/dL   Total Protein 6.7  6.0 - 8.3 g/dL   Albumin 3.8  3.5 - 5.2 g/dL   AST 725112 (*) 0 - 37 U/L   ALT 184 (*) 0 - 35 U/L   Alkaline Phosphatase 59  39 - 117 U/L   Total Bilirubin 0.4  0.3 - 1.2 mg/dL   GFR calc non Af Amer >90  >90 mL/min   GFR calc Af Amer >90  >90 mL/min   Anion gap 12  5 - 15  [place] Ob Comp Less 14 Wks  01/04/2014   CLINICAL DATA:  Pelvic pain for 1 week. Bleeding this morning. History of right-sided ectopic. Six weeks 1 day pregnant by last menstrual. Beta HCG of 43.  EXAM: OBSTETRIC <14 WK US AND TRANSVAGINAL OB US  TECHNIQUE: Both transabdominal and transvaginal ultrasound examinations were performed for complete evaluation of the gestation as well as the maternal uterus, adnexal regions, and pelvic cul-de-sac. Transvaginal technique was performed to assess early pregnancy.  COMPARISON:  01/26/2013  FINDINGS: Intrauterine gestational sac: Absent  Yolk sac:  Absent  Embryo:  Absent  Maternal uterus/adnexae: The adnexae are poorly evaluated secondary to patient body habitus and bowel gas. No ovarian or adnexal mass identified. No significant free fluid.  IMPRESSION: 1. Lack of intrauterine gestational sac or fetal pole. This most likely is due to early gestational age. Missed abortion or otherwise occult ectopic pregnancy cannot be excluded. Short-term beta HCG and possibly ultrasound followup suggested. 2. Mildly degraded evaluation of the ovaries/adnexa. No ovarian or adnexal mass seen.   Electronically Signed   By: Jeronimo GreavesKyle  Talbot M.D.   On: 01/04/2014 16:21   [place] Ob Transvaginal  01/04/2014   CLINICAL DATA:  Pelvic pain for 1 week. Bleeding this morning. History of right-sided ectopic. Six weeks 1 day pregnant by last menstrual. Beta HCG of 43.  EXAM:  OBSTETRIC <14 WK US AND TRANSVAGINAL OB US  TECHNIQUE: Both transabdominal and transvaginal ultrasound examinations were performed for complete evaluation of the gestation as well as the maternal uterus, adnexal regions, and pelvic cul-de-sac.  Transvaginal technique was performed to assess early pregnancy.  COMPARISON:  01/26/2013  FINDINGS: Intrauterine gestational sac: Absent  Yolk sac:  Absent  Embryo:  Absent  Maternal uterus/adnexae: The adnexae are poorly evaluated secondary to patient body habitus and bowel gas. No ovarian or adnexal mass identified. No significant free fluid.  IMPRESSION: 1. Lack of intrauterine gestational sac or fetal pole. This most likely is due to early gestational age. Missed abortion or otherwise occult ectopic pregnancy cannot be excluded. Short-term beta HCG and possibly ultrasound followup suggested. 2. Mildly degraded evaluation of the ovaries/adnexa. No ovarian or adnexal mass seen.   Electronically Signed   By: Jeronimo Greaves M.D.   On: 01/04/2014 16:21       Ref Range 1:57 PM (01/04/14) 1wk ago (12/26/13) 33mo ago (02/01/13) 33mo ago (01/29/13) 33mo ago (01/26/13) 33mo ago (01/24/13) 57yr ago (09/19/10)    hCG, Beta Chain, Quant, S <5 mIU/mL 43 (H)  274 (H) CM 7 (H) CM 24 (H) CM 106 (H) CM 271 (H) CM 1488 ... (H)     Discussed with Dr. Jolayne Panther Repeat HCG in 48 hours given pt's history Results for orders placed during the hospital encounter of 01/04/14 (from the past 24 hour(s))  CBC     Status: None   Collection Time    01/04/14  1:57 PM      Result Value Ref Range   WBC 9.3  4.0 - 10.5 K/uL   RBC 4.23  3.87 - 5.11 MIL/uL   Hemoglobin 13.6  12.0 - 15.0 g/dL   HCT 16.1  09.6 - 04.5 %   MCV 93.4  78.0 - 100.0 fL   MCH 32.2  26.0 - 34.0 pg   MCHC 34.4  30.0 - 36.0 g/dL   RDW 40.9  81.1 - 91.4 %   Platelets 226  150 - 400 K/uL  HCG, QUANTITATIVE, PREGNANCY     Status: Abnormal   Collection Time    01/04/14  1:57 PM      Result Value Ref Range   hCG, Beta Chain,  Quant, S 43 (*) <5 mIU/mL  COMPREHENSIVE METABOLIC PANEL     Status: Abnormal   Collection Time    01/04/14  1:57 PM      Result Value Ref Range   Sodium 139  137 - 147 mEq/L   Potassium 4.0  3.7 - 5.3 mEq/L   Chloride 102  96 - 112 mEq/L   CO2 25  19 - 32 mEq/L   Glucose, Bld 100 (*) 70 - 99 mg/dL   BUN 9  6 - 23 mg/dL   Creatinine, Ser 7.82  0.50 - 1.10 mg/dL   Calcium 9.6  8.4 - 95.6 mg/dL   Total Protein 6.7  6.0 - 8.3 g/dL   Albumin 3.8  3.5 - 5.2 g/dL   AST 213 (*) 0 - 37 U/L   ALT 184 (*) 0 - 35 U/L   Alkaline Phosphatase 59  39 - 117 U/L   Total Bilirubin 0.4  0.3 - 1.2 mg/dL   GFR calc non Af Amer >90  >90 mL/min   GFR calc Af Amer >90  >90 mL/min   Anion gap 12  5 - 15  WET PREP, GENITAL     Status: None   Collection Time    01/04/14  5:00 PM      Result Value Ref Range   Yeast Wet Prep HPF POC NONE SEEN  NONE SEEN   Trich, Wet Prep  NONE SEEN  NONE SEEN   Clue Cells Wet Prep HPF POC NONE SEEN  NONE SEEN   WBC, Wet Prep HPF POC NONE SEEN  NONE SEEN   Assessment and Plan  Abdominal pain in pregnancy- return on Monday 01/06/2014 for repeat HCG Ectopic precautions Ectopic x3 SAB x1 Lemarcus Baggerly 01/04/2014, 1:43 PM

## 2014-01-05 NOTE — MAU Provider Note (Signed)
Attestation of Attending Supervision of Advanced Practitioner (CNM/NP): Evaluation and management procedures were performed by the Advanced Practitioner under my supervision and collaboration.  I have reviewed the Advanced Practitioner's note and chart, and I agree with the management and plan.  Orrie Schubert 01/05/2014 7:55 AM   

## 2014-01-06 ENCOUNTER — Encounter (HOSPITAL_COMMUNITY): Payer: Self-pay

## 2014-01-06 ENCOUNTER — Inpatient Hospital Stay (HOSPITAL_COMMUNITY)
Admission: AD | Admit: 2014-01-06 | Discharge: 2014-01-06 | Disposition: A | Discharge status: Home / Self Care | Payer: Self-pay | Source: Ambulatory Visit | Attending: Obstetrics & Gynecology | Admitting: Obstetrics & Gynecology

## 2014-01-06 DIAGNOSIS — O039 Complete or unspecified spontaneous abortion without complication: Secondary | ICD-10-CM

## 2014-01-06 LAB — HCG, QUANTITATIVE, PREGNANCY: HCG, BETA CHAIN, QUANT, S: 13 m[IU]/mL — AB (ref <5)

## 2014-01-06 NOTE — MAU Note (Signed)
Patient to MAU for repeat BHCG. States she is taking a medication from [place] similar to Tylenol for pain. States she is having lower abdominal pain like period cramping. States she has had cramping since she was 10 and this pain is not new. States she was told this was a failed pregnancy.  States she has bleeding that is heavier at times then a small amount. Small amount at this time.

## 2014-01-06 NOTE — MAU Provider Note (Signed)
S: Melinda Ramos 33 y.o.[id number].returns to MAU for follow up quant.  She denies any pain beyond what she normally experiences, and her bleeding is unchanged.  No fever, nausea or vomiting.  O:  Previous hCG level of 43 on 01/04/14 Results for orders placed during the hospital encounter of 01/06/14 (from the past 24 hour(s))  HCG, QUANTITATIVE, PREGNANCY     Status: Abnormal   Collection Time    01/06/14  3:50 PM      Result Value Ref Range   hCG, Beta Chain, Quant, S 13 (*) <5 mIU/mL   Filed Vitals:   01/06/14 1606  BP: 96/59  Pulse: 61  Temp: 98.7 F (37.1 C)  Resp: 16   Well developed, well nourished female in no acute distress.    Assessment: SAB  Plan: Discharge to home with precautions Follow up in clinic in 1 week  Return to MAU PRN.

## 2014-01-07 LAB — GC/CHLAMYDIA PROBE AMP
CT PROBE, AMP APTIMA: NEGATIVE
GC PROBE AMP APTIMA: NEGATIVE

## 2014-01-14 ENCOUNTER — Other Ambulatory Visit: Payer: Self-pay

## 2014-02-03 ENCOUNTER — Encounter (HOSPITAL_COMMUNITY): Payer: Self-pay | Admitting: *Deleted

## 2014-03-24 ENCOUNTER — Encounter (HOSPITAL_COMMUNITY): Payer: Self-pay

## 2014-12-17 IMAGING — US US OB TRANSVAGINAL
1 series · 13 of 28 positions shown · non-contrast
Comparison: None.

CLINICAL DATA: Pregnant, bleeding and right lower quadrant pain

OBSTETRIC <14 WK US AND TRANSVAGINAL OB US
TECHNIQUE: Both transabdominal and transvaginal ultrasound
examinations were performed for complete evaluation of the
gestation as well as the maternal uterus, adnexal regions, and
pelvic cul-de-sac.  Transvaginal technique was performed to assess
early pregnancy.

[Series 1: us ob comp less 14 wks · 53 acquisitions, 13 frames shown]
[im 2/53]
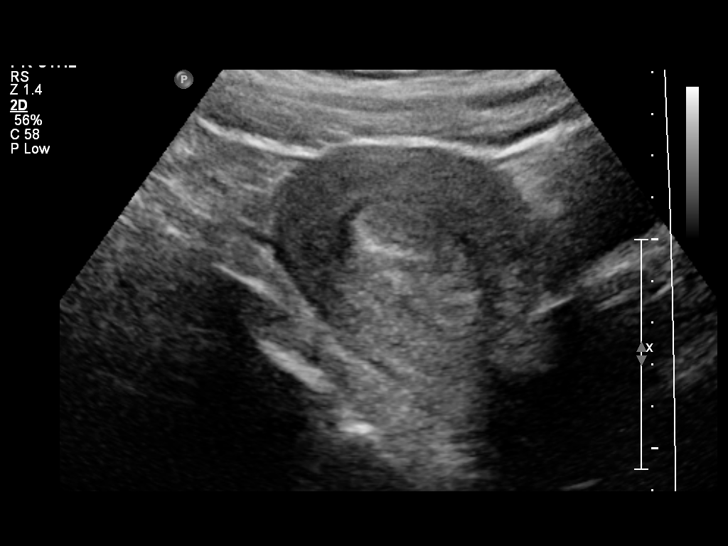
[im 6/53]
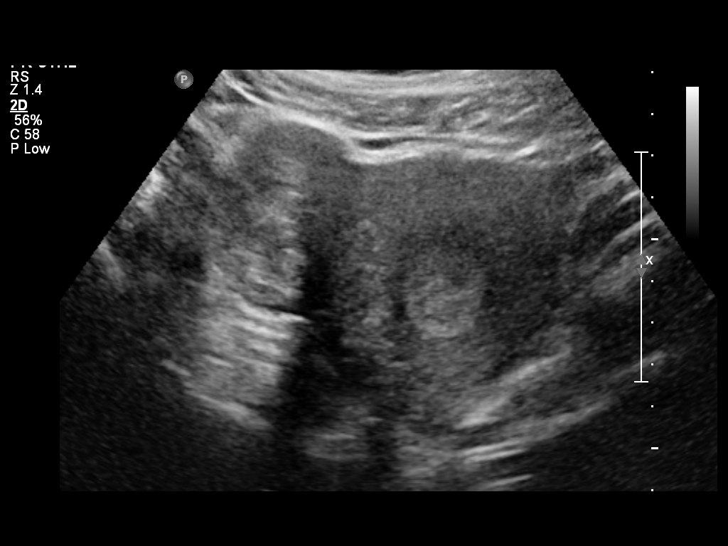
[im 10/53]
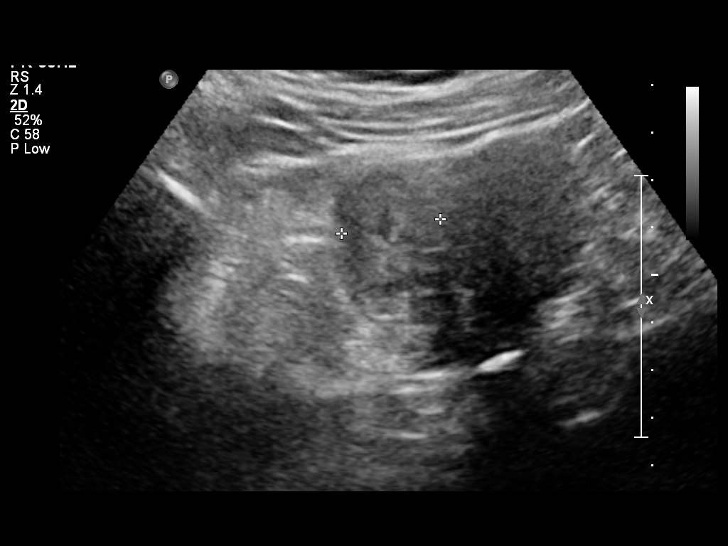
[im 14/53]
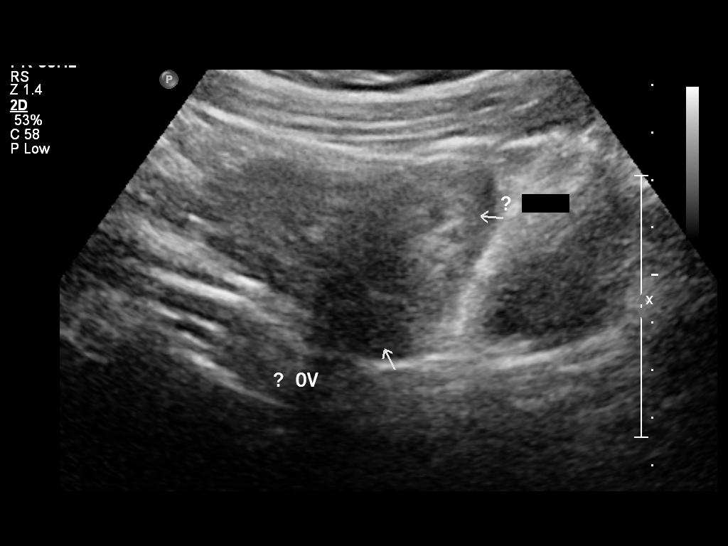
[im 18/53]
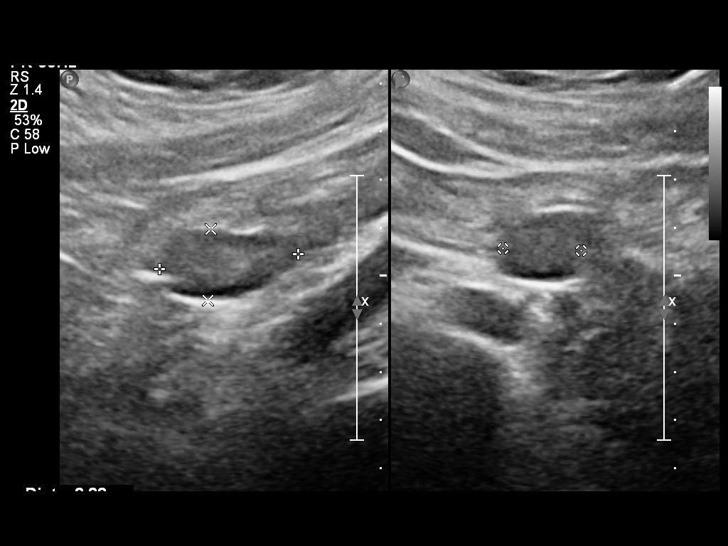
[im 22/53]
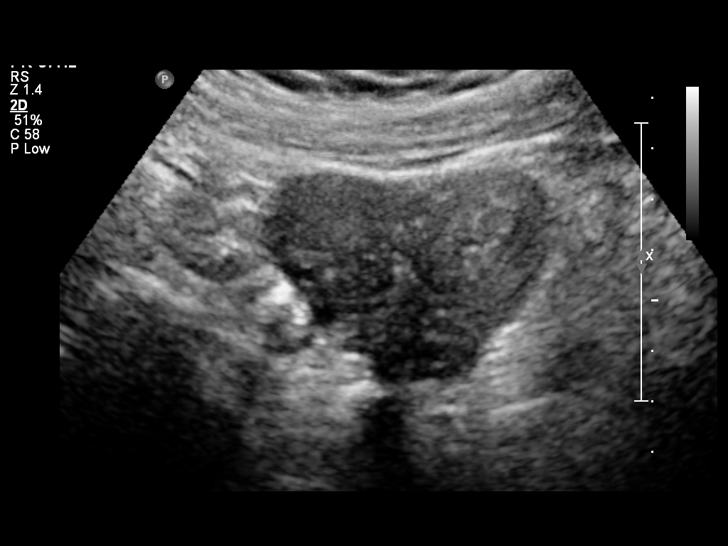
[im 27/53]
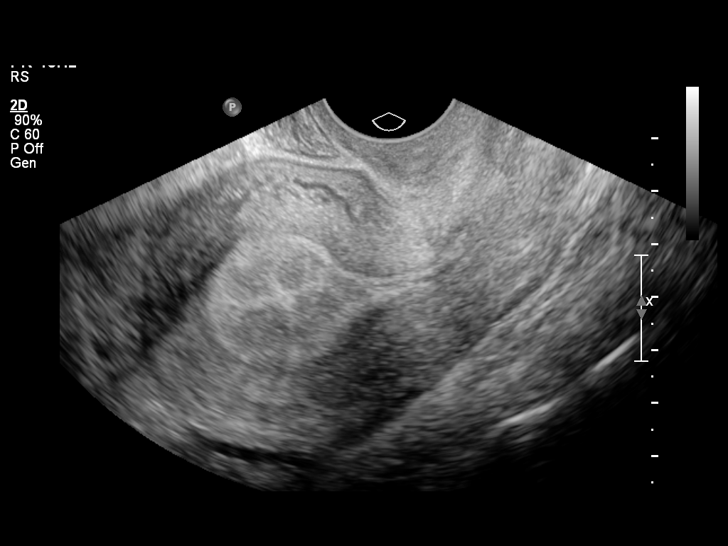
[im 31/53]
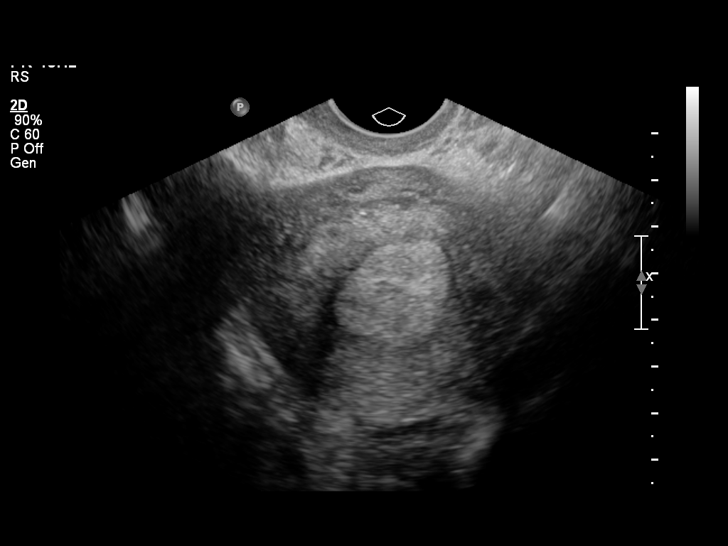
[im 35/53]
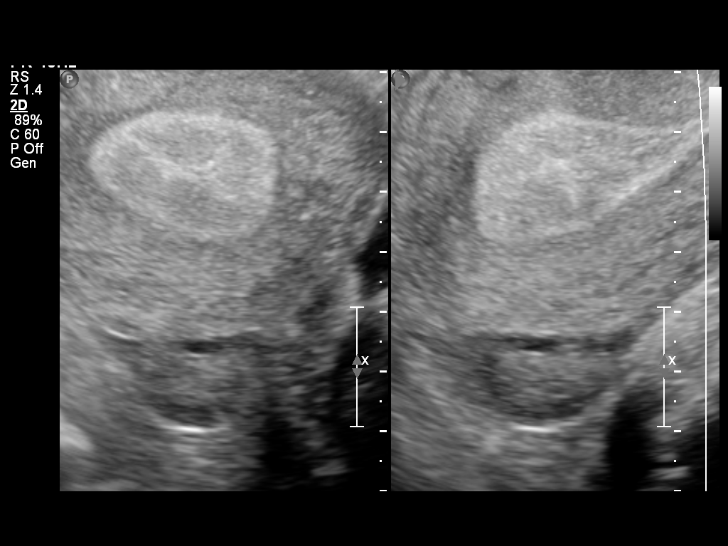
[im 39/53]
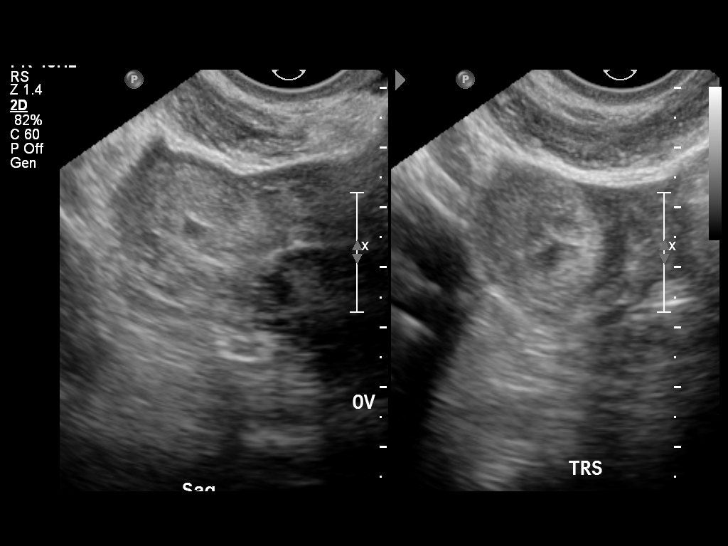
[im 43/53]
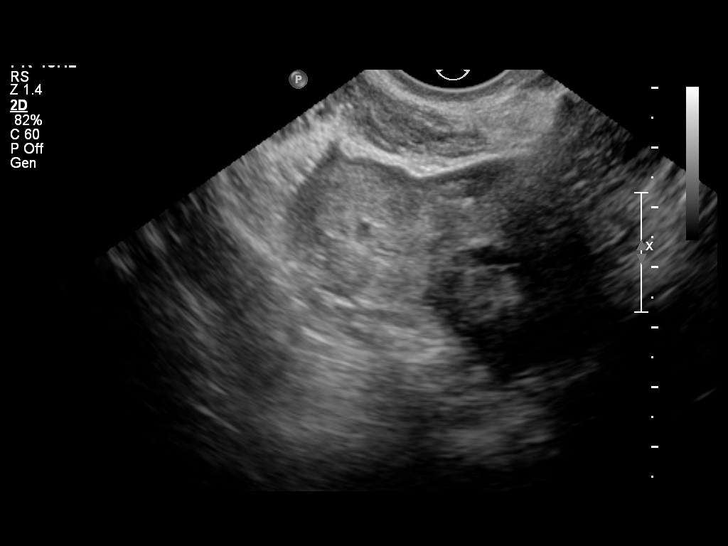
[im 47/53]
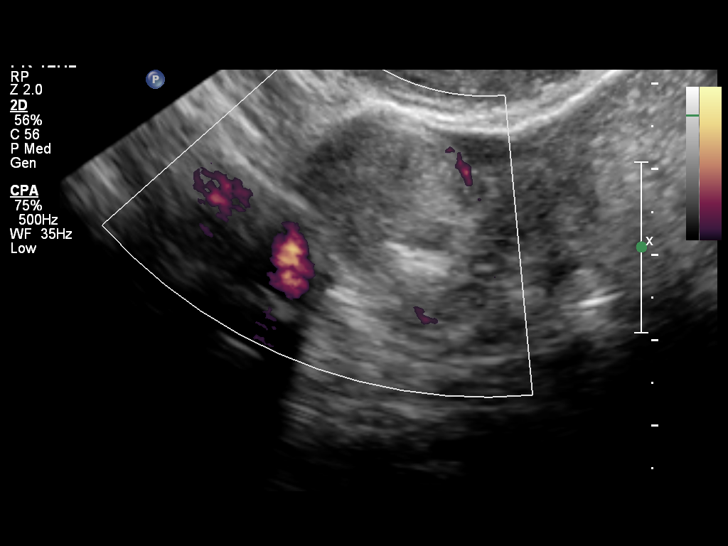
[im 51/53]
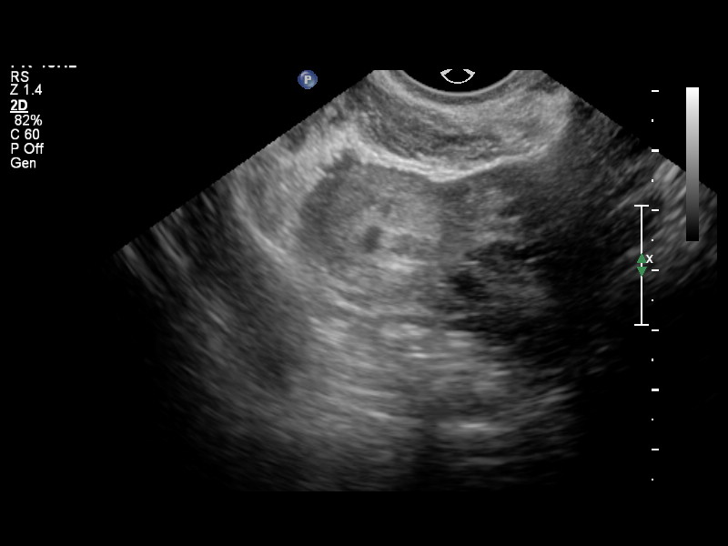

[13 of 28 positions shown; findings below may reference images not displayed]

Intrauterine gestational sac:  No intrauterine gestational sac
identified.
Yolk sac: None
Embryo: None
Cardiac Activity: None

Maternal uterus/adnexae:
Thickened, heterogeneous endometrium.  The endometrial lining
measures up to 2.5 cm in thickness.  The right ovary measures 3.0 x
2.4 x 2.0 cm. There is a nonspecific 2.6 x 2.4 x 3.3 cm
heterogeneous structure adjacent to, but separate from the right
ovary. No evidence of hypervascularity associated with this
structure on color or powered Doppler analysis.  The left ovary is
sonographically unremarkable at 2.5 x 1.3 x 1.8 cm.  The both
arterial and venous flow demonstrated on color Doppler imaging. No
free fluid.
IMPRESSION: 1.  A heterogeneous predominately echogenic 2.6 x 2.4 x 3.3 cm
structure adjacent to but separate from the right ovary is
nonspecific by imaging.  However, in the setting of a positive
pregnancy test with no intrauterine gestation and right lower
quadrant pain, an ectopic pregnancy is suspected.

2.  Heterogeneous and thickened endometrium measuring up to 2.5 cm.

Critical Value/emergent results were called by telephone at the
time of interpretation on 01/24/2013 at [DATE] p.m. to Polin,
obstetrical PA, who verbally acknowledged these results.

## 2016-12-09 ENCOUNTER — Encounter (HOSPITAL_COMMUNITY): Payer: Self-pay | Admitting: *Deleted

## 2016-12-09 ENCOUNTER — Inpatient Hospital Stay (HOSPITAL_COMMUNITY)
Admission: AD | Admit: 2016-12-09 | Discharge: 2016-12-09 | Disposition: A | Discharge status: Home / Self Care | Payer: Self-pay | Source: Ambulatory Visit | Attending: Obstetrics & Gynecology | Admitting: Obstetrics & Gynecology

## 2016-12-09 ENCOUNTER — Inpatient Hospital Stay (HOSPITAL_COMMUNITY): Payer: Self-pay

## 2016-12-09 DIAGNOSIS — Z3A00 Weeks of gestation of pregnancy not specified: Secondary | ICD-10-CM | POA: Insufficient documentation

## 2016-12-09 DIAGNOSIS — Z87891 Personal history of nicotine dependence: Secondary | ICD-10-CM | POA: Insufficient documentation

## 2016-12-09 DIAGNOSIS — R1030 Lower abdominal pain, unspecified: Secondary | ICD-10-CM | POA: Insufficient documentation

## 2016-12-09 DIAGNOSIS — O26891 Other specified pregnancy related conditions, first trimester: Secondary | ICD-10-CM | POA: Insufficient documentation

## 2016-12-09 DIAGNOSIS — O3680X0 Pregnancy with inconclusive fetal viability, not applicable or unspecified: Secondary | ICD-10-CM

## 2016-12-09 DIAGNOSIS — O209 Hemorrhage in early pregnancy, unspecified: Secondary | ICD-10-CM

## 2016-12-09 DIAGNOSIS — O2621 Pregnancy care for patient with recurrent pregnancy loss, first trimester: Secondary | ICD-10-CM | POA: Insufficient documentation

## 2016-12-09 LAB — CBC
HEMATOCRIT: 40.2 % (ref 36.0–46.0)
HEMOGLOBIN: 13.7 g/dL (ref 12.0–15.0)
MCH: 30.4 pg (ref 26.0–34.0)
MCHC: 34.1 g/dL (ref 30.0–36.0)
MCV: 89.1 fL (ref 78.0–100.0)
Platelets: 214 10*3/uL (ref 150–400)
RBC: 4.51 MIL/uL (ref 3.87–5.11)
RDW: 13.1 % (ref 11.5–15.5)
WBC: 11.2 10*3/uL — AB (ref 4.0–10.5)

## 2016-12-09 LAB — URINALYSIS, MICROSCOPIC (REFLEX)
BACTERIA UA: NONE SEEN
Squamous Epithelial / LPF: NONE SEEN
WBC UA: NONE SEEN WBC/hpf (ref 0–5)

## 2016-12-09 LAB — URINALYSIS, ROUTINE W REFLEX MICROSCOPIC

## 2016-12-09 LAB — POCT PREGNANCY, URINE: PREG TEST UR: NEGATIVE

## 2016-12-09 LAB — HCG, QUANTITATIVE, PREGNANCY: HCG, BETA CHAIN, QUANT, S: 18 m[IU]/mL — AB (ref <5)

## 2016-12-09 MED ORDER — HYDROMORPHONE HCL 1 MG/ML IJ SOLN
1.0000 mg | Freq: Once | INTRAMUSCULAR | Status: AC
  Administered 2016-12-09: 1 mg via INTRAMUSCULAR
  Filled 2016-12-09: qty 1

## 2016-12-09 NOTE — Discharge Instructions (Signed)
Hemorragia vaginal durante el embarazo (primer trimestre) (Vaginal Bleeding During Pregnancy, First Trimester) Durante los primeros meses de [place], es comn tener una pequea hemorragia vaginal (manchas). A veces, la hemorragia es normal y no representa un problema, pero en algunas ocasiones es un sntoma de algo grave. Asegrese de decirle a su mdico de inmediato si tiene algn tipo de hemorragia vaginal. CUIDADOS EN EL HOGAR  Controle su afeccin para ver si hay cambios.  Siga las indicaciones de su mdico con respecto al [place] de actividad que [place].  Si debe hacer reposo en cama: ? Es posible que deba quedarse en cama y levantarse nicamente para ir al bao. ? Equities trader [employer]. ? Si es necesario, planifique que alguien la ayude.  Marcelino [place]: ? La cantidad de toallas higinicas que [place] cada da. ? La frecuencia con la que se cambia las toallas higinicas. ? Indique que tan empapados (saturados) estn.  No use tampones.  No se haga duchas vaginales.  No tenga relaciones sexuales ni orgasmos hasta que el mdico la autorice.  Si elimina tejido por la vagina, gurdelo para mostrrselo al [employer].  Tome los medicamentos solamente como se lo haya indicado el mdico.  No tome aspirina, ya que puede causar hemorragias.  Concurra a todas las visitas de control como se lo haya indicado el mdico. SOLICITE AYUDA SI:  Tiene una hemorragia vaginal.  Tiene clicos.  Tiene dolores de [place].  Tiene fiebre que no desaparece despus de Teacher, adult education. SOLICITE AYUDA DE INMEDIATO SI:  Siente clicos muy intensos en la espalda o en el vientre (abdomen).  Elimina cogulos grandes o tejido por la vagina.  Tiene ms hemorragia.  Se siente dbil o que va a desvanecerse.  Pierde el conocimiento (se desmaya).  Tiene escalofros.  Tiene una prdida importante o sale lquido a borbotones por la vagina.  Se desmaya mientras defeca. ASEGRESE DE  QUE:  Comprende estas instrucciones.  Controlar su afeccin.  Recibir ayuda de inmediato si no mejora o si empeora. Esta informacin no tiene Theme park manager el consejo del mdico. Asegrese de hacerle al mdico cualquier pregunta que tenga. Document Released: 09/23/2013 Document Revised: 09/23/2013 Document Reviewed: 01/14/2013 Elsevier Interactive Patient Education  2018 [employer]. Amenaza de aborto (Threatened Miscarriage) La amenaza de aborto se produce cuando hay hemorragia vaginal durante las primeras 20semanas de [place], pero el embarazo no se interrumpe. El [employer] har pruebas para asegurarse de que el embarazo contine. La causa de la hemorragia puede ser desconocida. Este trastorno no significa que Administrator, arts. Sin embargo, Audiological scientist riesgo de que el embarazo se interrumpa (aborto completo). CUIDADOS EN EL HOGAR  Asegrese de asistir a todas las citas de cuidados prenatales con el mdico.  Descanse lo suficiente.  No tenga relaciones sexuales ni use tampones si tiene hemorragia vaginal.  No se haga duchas vaginales.  No fume ni consuma drogas.  No beba alcohol.  Evite la cafena. SOLICITE AYUDA SI:  Tiene una hemorragia leve de la vagina.  Tiene dolor o clicos abdominales.  Tiene fiebre. SOLICITE AYUDA DE INMEDIATO SI:  Tiene hemorragia abundante de la vagina.  Elimina cogulos de sangre por la vagina.  Tiene mucho dolor en el abdomen o la parte baja de la espalda, clicos abdominales o calambres en la parte baja de la espalda.  Tiene fiebre, escalofros y mucho dolor abdominal. ASEGRESE DE QUE:  Comprende estas instrucciones.  Controlar su afeccin.  Recibir ayuda de inmediato si no mejora o si  empeora. Esta informacin no tiene Theme park managercomo fin reemplazar el consejo del mdico. Asegrese de hacerle al mdico cualquier pregunta que tenga. Document Released: 06/11/2010 Document Revised: 05/14/2013 Document Reviewed:  03/05/2013 Elsevier Interactive Patient Education  [employer]2018 Elsevier Inc.

## 2016-12-09 NOTE — MAU Note (Addendum)
Onset of vaginal bleeding heavy not wearing a pad has something (tissue) there and lower abdominal pain started yesterday as well. Had positive UPT confirmed by her doctor. LMP 6/4

## 2016-12-09 NOTE — Progress Notes (Signed)
Discussed AVS and discharge plan with patient via [place], Research officer, trade unionpanish Interpreter.  Patient verbalized understanding.  WIll return for f/u on Sunday.

## 2016-12-09 NOTE — MAU Provider Note (Signed)
History     CSN: [id number]  Arrival date and time: 12/09/16 1350   None     Chief Complaint  Patient presents with  . Vaginal Bleeding  . Abdominal Pain   HPI Melinda Ramos is 36 y.o. [id number] presenting with abdominal pain and vaginal bleeding.  LMP 10/24/2016.  States she had + UPT confirmed by her OB office in [place].  Spotting began yesterday and worsened to bleeding and passing clot/? Tissue today.  She is rating her pain 9/10 pointing to her lower abdomen.  She is Spanish speaking, Interpreter in room.  She has HISTORY OF 1 ECTOPIC and 3 SABs with no successful pregnancies.   Past Medical History:  Diagnosis Date  . History of multiple miscarriages     Past Surgical History:  Procedure Laterality Date  . DILATION AND CURETTAGE OF UTERUS    . FACIAL FRACTURE SURGERY    . WISDOM TOOTH EXTRACTION      History reviewed. No pertinent family history.  Social History  Substance Use Topics  . Smoking status: Former Smoker    Quit date: 05/26/2002  . Smokeless tobacco: Never Used  . Alcohol use Yes     Comment: occasional Alchol 1 ever 1-2 weeks    Allergies: No Known Allergies  Prescriptions Prior to Admission  Medication Sig Dispense Refill Last Dose  . Prenatal Vit-Fe Fumarate-FA (PRENATAL MULTIVITAMIN) TABS tablet Take 1 tablet by mouth daily.   12/08/2016 at Unknown time    Review of Systems  Constitutional: Negative for fever.  Respiratory: Negative for chest tightness.   Cardiovascular: Negative for chest pain.  Gastrointestinal: Positive for abdominal pain (lower abdomen rating 9/10).  Genitourinary: Positive for vaginal bleeding. Negative for dysuria.  Neurological: Negative for headaches.   Physical Exam   Blood pressure 124/65, pulse 69, temperature 98.3 F (36.8 C), temperature source Oral, resp. rate 16, unknown if currently breastfeeding.  Physical Exam  Constitutional: She is oriented to person, place, and time. She appears  well-developed and well-nourished. No distress.  HENT:  Head: Normocephalic.  Neck: Normal range of motion.  Cardiovascular: Normal rate.   Respiratory: Effort normal.  GI: There is tenderness (lower abdominal tenderness on exam). There is rebound and guarding.  Genitourinary: There is no rash, tenderness or lesion on the right labia. There is no rash, tenderness or lesion on the left labia. Uterus is tender. Uterus is not enlarged. Cervix exhibits no motion tenderness, no discharge and no friability. Right adnexum displays tenderness. Right adnexum displays no mass and no fullness. Left adnexum displays tenderness. Left adnexum displays no mass and no fullness. There is bleeding (moderate amount of bleeding without clot or tissue noted ) in the vagina.  Neurological: She is alert and oriented to person, place, and time.  Skin: Skin is warm and dry.  Psychiatric: She has a normal mood and affect. Her behavior is normal.   Results for orders placed or performed during the hospital encounter of 12/09/16 (from the past 24 hour(s))  Urinalysis, Routine w reflex microscopic     Status: Abnormal   Collection Time: 12/09/16  2:00 PM  Result Value Ref Range   Color, Urine RED (A) YELLOW   APPearance TURBID (A) CLEAR   Specific Gravity, Urine  1.005 - 1.030    TEST NOT REPORTED DUE TO COLOR INTERFERENCE OF URINE PIGMENT   pH  5.0 - 8.0    TEST NOT REPORTED DUE TO COLOR INTERFERENCE OF URINE PIGMENT   Glucose, UA (  A) NEGATIVE mg/dL    TEST NOT REPORTED DUE TO COLOR INTERFERENCE OF URINE PIGMENT   Hgb urine dipstick (A) NEGATIVE    TEST NOT REPORTED DUE TO COLOR INTERFERENCE OF URINE PIGMENT   Bilirubin Urine (A) NEGATIVE    TEST NOT REPORTED DUE TO COLOR INTERFERENCE OF URINE PIGMENT   Ketones, ur (A) NEGATIVE mg/dL    TEST NOT REPORTED DUE TO COLOR INTERFERENCE OF URINE PIGMENT   Protein, ur (A) NEGATIVE mg/dL    TEST NOT REPORTED DUE TO COLOR INTERFERENCE OF URINE PIGMENT   Nitrite (A)  NEGATIVE    TEST NOT REPORTED DUE TO COLOR INTERFERENCE OF URINE PIGMENT   Leukocytes, UA (A) NEGATIVE    TEST NOT REPORTED DUE TO COLOR INTERFERENCE OF URINE PIGMENT  Urinalysis, Microscopic (reflex)     Status: None   Collection Time: 12/09/16  2:00 PM  Result Value Ref Range   RBC / HPF TOO NUMEROUS TO COUNT 0 - 5 RBC/hpf   WBC, UA NONE SEEN 0 - 5 WBC/hpf   Bacteria, UA NONE SEEN NONE SEEN   Squamous Epithelial / LPF NONE SEEN NONE SEEN  Pregnancy, urine POC     Status: None   Collection Time: 12/09/16  2:39 PM  Result Value Ref Range   Preg Test, Ur NEGATIVE NEGATIVE  CBC     Status: Abnormal   Collection Time: 12/09/16  2:54 PM  Result Value Ref Range   WBC 11.2 (H) 4.0 - 10.5 K/uL   RBC 4.51 3.87 - 5.11 MIL/uL   Hemoglobin 13.7 12.0 - 15.0 g/dL   HCT 78.2 95.6 - 21.3 %   MCV 89.1 78.0 - 100.0 fL   MCH 30.4 26.0 - 34.0 pg   MCHC 34.1 30.0 - 36.0 g/dL   RDW 08.6 57.8 - 46.9 %   Platelets 214 150 - 400 K/uL  hCG, quantitative, pregnancy     Status: Abnormal   Collection Time: 12/09/16  2:54 PM  Result Value Ref Range   hCG, Beta Chain, Quant, S 18 (H) <5 mIU/mL   BLOOD TYPE O Positive  US Ob Comp Less 14 Wks  Result Date: 12/09/2016 CLINICAL DATA:  Pain and bleeding, positive pregnancy test; which quantitative beta HCG = 18 EXAM: OBSTETRIC <14 WK [place] AND TRANSVAGINAL OB US TECHNIQUE: Both transabdominal and transvaginal ultrasound examinations were performed for complete evaluation of the gestation as well as the maternal uterus, adnexal regions, and pelvic cul-de-sac. Transvaginal technique was performed to assess early pregnancy. COMPARISON:  None for this gestation FINDINGS: Intrauterine gestational sac: Absent Yolk sac:  N/A Embryo:  N/A Cardiac Activity: N/A Heart Rate: N/A  bpm Subchorionic hemorrhage:  N/A Maternal uterus/adnexae: Trophoblastic reaction within uterus. Small amount of mobile hypoechoic material within the endometrial canal likely blood. No full fluid  collection or gestational sac identified. Uterus otherwise normal appearance. RIGHT ovary normal size and morphology 2.9 x 1.7 x 1.9 cm. LEFT ovary normal size and morphology 3.2 x 1.5 x 2.5 cm. No free pelvic fluid or adnexal masses. IMPRESSION: No intrauterine gestation identified. Suspected blood within endometrial canal. Findings are compatible with pregnancy of unknown location. Differential diagnosis includes early intrauterine pregnancy too early to visualize, spontaneous abortion, and ectopic pregnancy. Serial quantitative beta HCG and or followup ultrasound recommended to definitively exclude ectopic pregnancy. Electronically Signed   By: Ulyses Southward M.D.   On: 12/09/2016 16:40   US Ob Transvaginal  Result Date: 12/09/2016 CLINICAL DATA:  Pain and bleeding, positive pregnancy  test; which quantitative beta HCG = 18 EXAM: OBSTETRIC <14 WK [place] AND TRANSVAGINAL OB US TECHNIQUE: Both transabdominal and transvaginal ultrasound examinations were performed for complete evaluation of the gestation as well as the maternal uterus, adnexal regions, and pelvic cul-de-sac. Transvaginal technique was performed to assess early pregnancy. COMPARISON:  None for this gestation FINDINGS: Intrauterine gestational sac: Absent Yolk sac:  N/A Embryo:  N/A Cardiac Activity: N/A Heart Rate: N/A  bpm Subchorionic hemorrhage:  N/A Maternal uterus/adnexae: Trophoblastic reaction within uterus. Small amount of mobile hypoechoic material within the endometrial canal likely blood. No full fluid collection or gestational sac identified. Uterus otherwise normal appearance. RIGHT ovary normal size and morphology 2.9 x 1.7 x 1.9 cm. LEFT ovary normal size and morphology 3.2 x 1.5 x 2.5 cm. No free pelvic fluid or adnexal masses. IMPRESSION: No intrauterine gestation identified. Suspected blood within endometrial canal. Findings are compatible with pregnancy of unknown location. Differential diagnosis includes early intrauterine pregnancy  too early to visualize, spontaneous abortion, and ectopic pregnancy. Serial quantitative beta HCG and or followup ultrasound recommended to definitively exclude ectopic pregnancy. Electronically Signed   By: Ulyses Southward M.D.   On: 12/09/2016 16:40    MAU Course  Procedures  MDM MSE Exam Labs U/S Dilaudid 1mg  IM given to patient for pain  Assessment and Plan  A;  Vaginal bleeding       + UPT at her PCP office       Negative UTP here.       Previous hx of SAB X 3       History of ectopic pregnancy      Suspect miscarriage but cannot exclude ectopic  P: Labs and U/S discussed with patient     Return for repeat BHCG in 48 hrs  Eve M Mackinley Cassaday 12/09/2016, 4:32 PM

## 2016-12-10 LAB — HIV ANTIBODY (ROUTINE TESTING W REFLEX): HIV Screen 4th Generation wRfx: NONREACTIVE

## 2018-01-05 ENCOUNTER — Encounter (HOSPITAL_COMMUNITY): Payer: Self-pay | Admitting: Emergency Medicine

## 2018-01-05 ENCOUNTER — Other Ambulatory Visit: Payer: Self-pay

## 2018-01-05 ENCOUNTER — Emergency Department (HOSPITAL_COMMUNITY)
Admission: EM | Admit: 2018-01-05 | Discharge: 2018-01-05 | Disposition: A | Discharge status: Home / Self Care | Payer: Self-pay | Attending: Emergency Medicine | Admitting: Emergency Medicine

## 2018-01-05 ENCOUNTER — Emergency Department (HOSPITAL_COMMUNITY): Payer: Self-pay

## 2018-01-05 DIAGNOSIS — B349 Viral infection, unspecified: Secondary | ICD-10-CM | POA: Insufficient documentation

## 2018-01-05 DIAGNOSIS — R1084 Generalized abdominal pain: Secondary | ICD-10-CM | POA: Insufficient documentation

## 2018-01-05 DIAGNOSIS — Z87891 Personal history of nicotine dependence: Secondary | ICD-10-CM | POA: Insufficient documentation

## 2018-01-05 HISTORY — DX: Fatty (change of) liver, not elsewhere classified: K76.0

## 2018-01-05 LAB — CBC WITH DIFFERENTIAL/PLATELET
BASOS ABS: 0 10*3/uL (ref 0.0–0.1)
BASOS PCT: 0 %
Eosinophils Absolute: 0.2 10*3/uL (ref 0.0–0.7)
Eosinophils Relative: 2 %
HEMATOCRIT: 39.7 % (ref 36.0–46.0)
HEMOGLOBIN: 13.3 g/dL (ref 12.0–15.0)
Lymphocytes Relative: 30 %
Lymphs Abs: 3.2 10*3/uL (ref 0.7–4.0)
MCH: 31.2 pg (ref 26.0–34.0)
MCHC: 33.5 g/dL (ref 30.0–36.0)
MCV: 93.2 fL (ref 78.0–100.0)
MONOS PCT: 5 %
Monocytes Absolute: 0.6 10*3/uL (ref 0.1–1.0)
NEUTROS ABS: 6.8 10*3/uL (ref 1.7–7.7)
NEUTROS PCT: 63 %
Platelets: 256 10*3/uL (ref 150–400)
RBC: 4.26 MIL/uL (ref 3.87–5.11)
RDW: 13 % (ref 11.5–15.5)
WBC: 10.9 10*3/uL — AB (ref 4.0–10.5)

## 2018-01-05 LAB — URINALYSIS, ROUTINE W REFLEX MICROSCOPIC
BACTERIA UA: NONE SEEN
BILIRUBIN URINE: NEGATIVE
Glucose, UA: NEGATIVE mg/dL
Ketones, ur: NEGATIVE mg/dL
Leukocytes, UA: NEGATIVE
Nitrite: NEGATIVE
Protein, ur: NEGATIVE mg/dL
Specific Gravity, Urine: 1.006 (ref 1.005–1.030)
pH: 6 (ref 5.0–8.0)

## 2018-01-05 LAB — COMPREHENSIVE METABOLIC PANEL
ALK PHOS: 71 U/L (ref 38–126)
ALT: 23 U/L (ref 0–44)
ANION GAP: 6 (ref 5–15)
AST: 19 U/L (ref 15–41)
Albumin: 4.2 g/dL (ref 3.5–5.0)
BUN: 11 mg/dL (ref 6–20)
CALCIUM: 8.9 mg/dL (ref 8.9–10.3)
CO2: 23 mmol/L (ref 22–32)
Chloride: 108 mmol/L (ref 98–111)
Creatinine, Ser: 0.64 mg/dL (ref 0.44–1.00)
GFR calc non Af Amer: >60 mL/min (ref >60)
Glucose, Bld: 107 mg/dL — ABNORMAL HIGH (ref 70–99)
Potassium: 3.8 mmol/L (ref 3.5–5.1)
SODIUM: 137 mmol/L (ref 135–145)
TOTAL PROTEIN: 7.5 g/dL (ref 6.5–8.1)
Total Bilirubin: 0.6 mg/dL (ref 0.3–1.2)

## 2018-01-05 LAB — GROUP A STREP BY PCR: Group A Strep by PCR: NOT DETECTED

## 2018-01-05 LAB — PREGNANCY, URINE: Preg Test, Ur: NEGATIVE
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
## 2018-01-05 NOTE — ED Triage Notes (Signed)
Triaging using interpreter IPAD. Patient brought in by EMS for complaint of shortness of breath, headache, and difficulty swallowing. States she has these symptoms intermittent for a while but today was more severe.

## 2018-01-05 NOTE — Discharge Instructions (Addendum)
Ibuprofen or tylenol for discomfort. Drink plenty of fluids. Please refer to the primary care provider information attached.

## 2018-01-05 NOTE — ED Notes (Signed)
Pt returned from X Ray.

## 2018-11-01 IMAGING — US US OB TRANSVAGINAL
1 series · 15 of 28 positions shown · non-contrast
Comparison: None for this gestation

CLINICAL DATA: Pain and bleeding, positive pregnancy test; which
quantitative beta HCG = 18

EXAM:
OBSTETRIC <14 WK US AND TRANSVAGINAL OB US
TECHNIQUE: Both transabdominal and transvaginal ultrasound examinations were
performed for complete evaluation of the gestation as well as the
maternal uterus, adnexal regions, and pelvic cul-de-sac.
Transvaginal technique was performed to assess early pregnancy.

[Series 1: us ob transvaginal · 64 acquisitions, 15 frames shown]
[im 1/64]
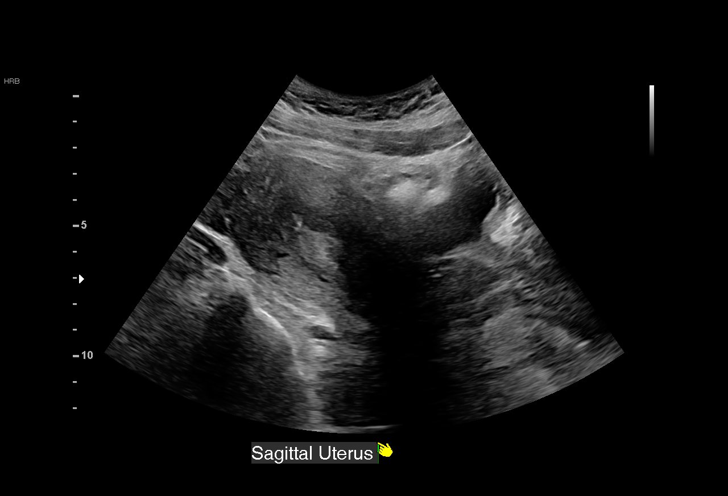
[im 5/64]
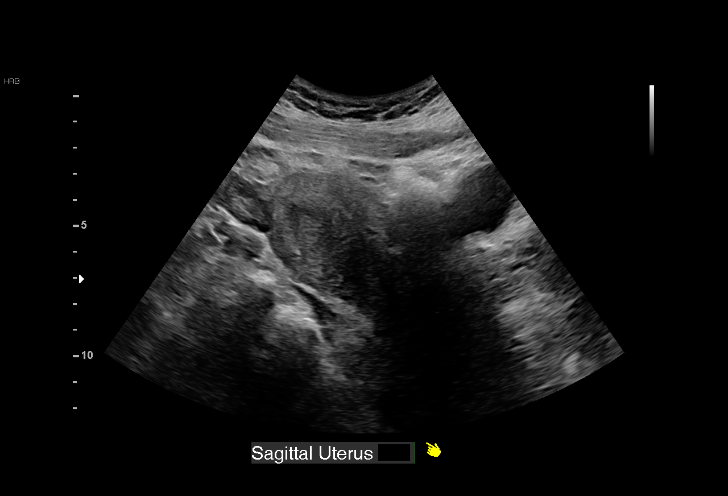
[im 10/64]
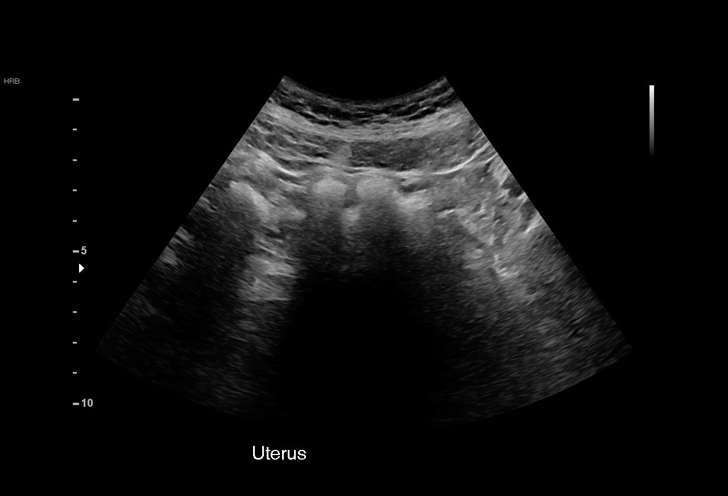
[im 15/64]
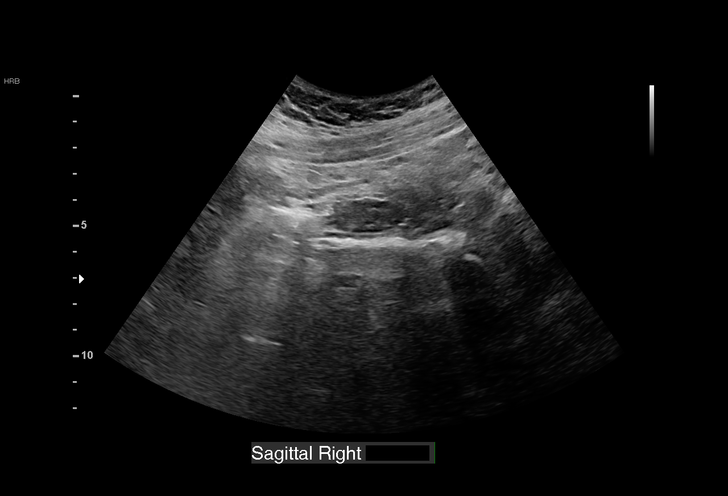
[im 19/64]
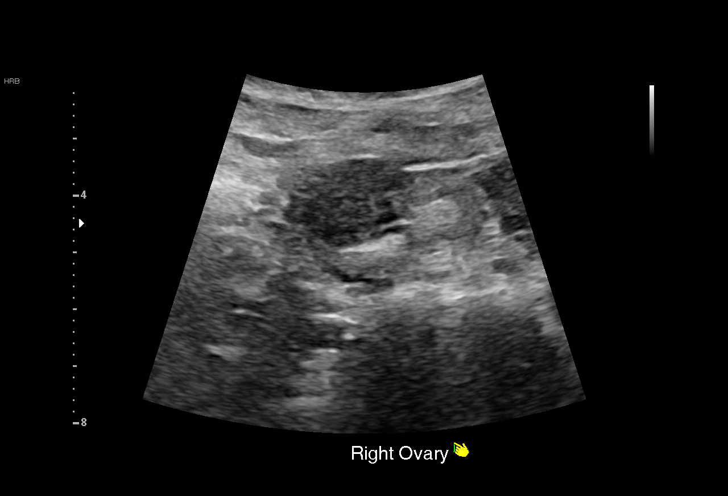
[im 24/64]
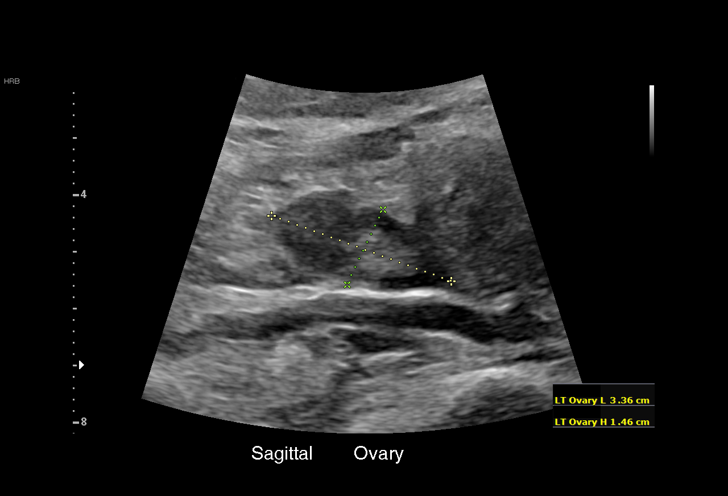
[im 29/64]
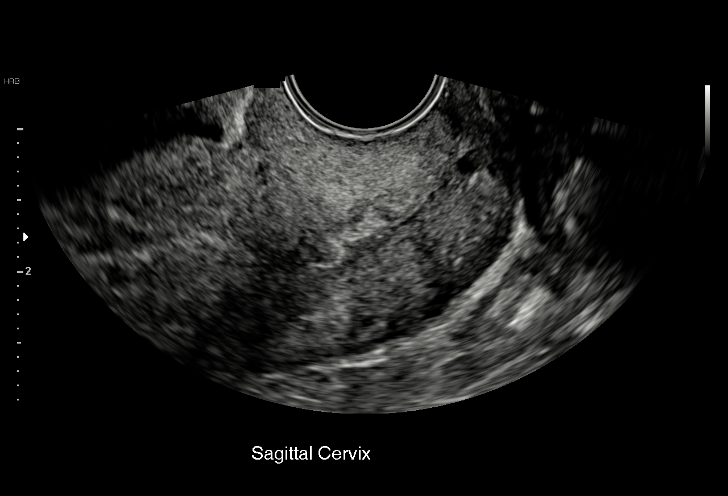
[im 33/64]
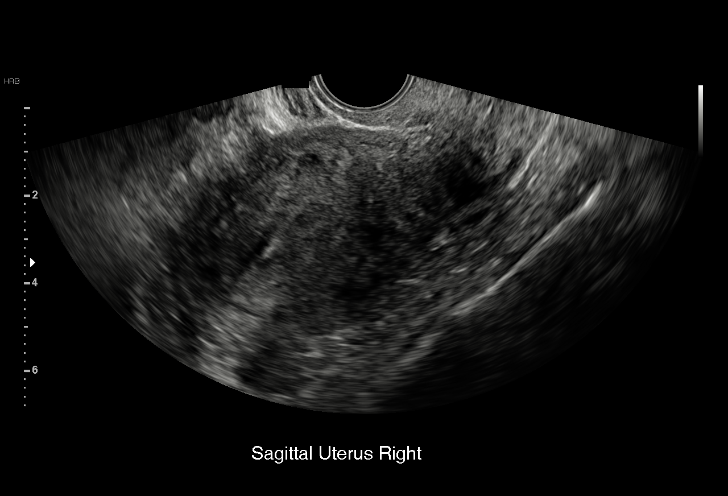
[im 36/64]
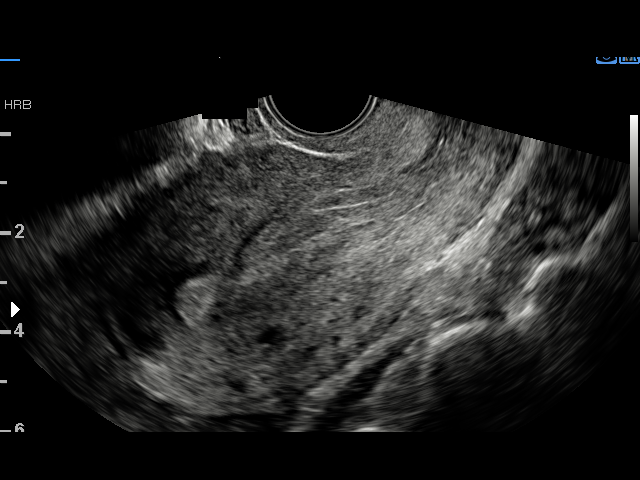
[im 40/64]
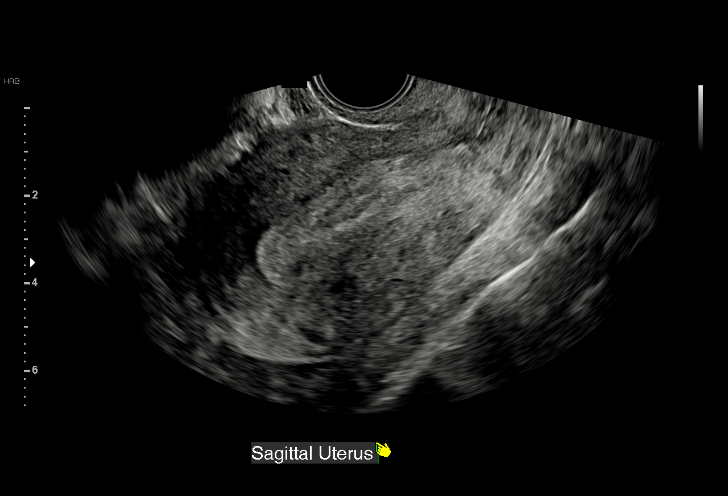
[im 45/64]
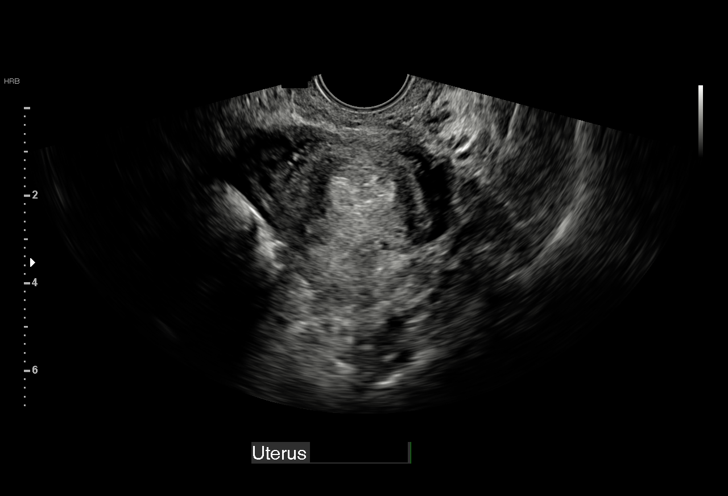
[im 50/64]
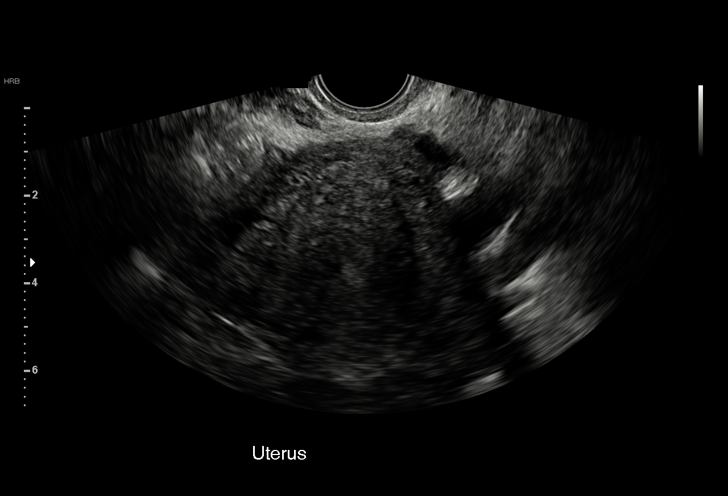
[im 54/64]
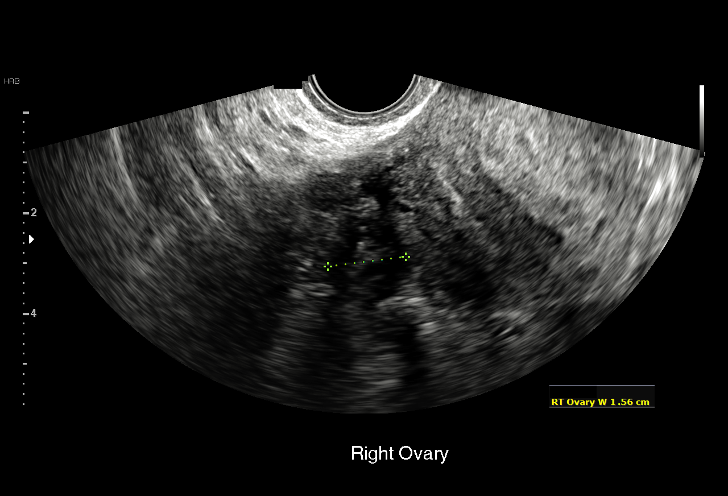
[im 59/64]
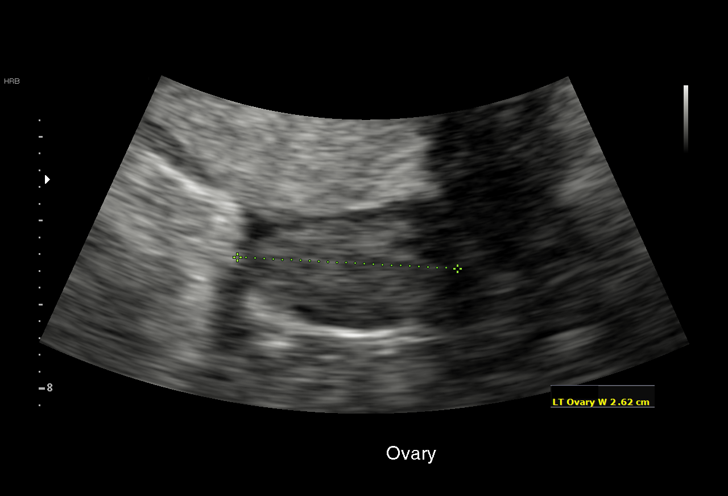
[im 64/64]
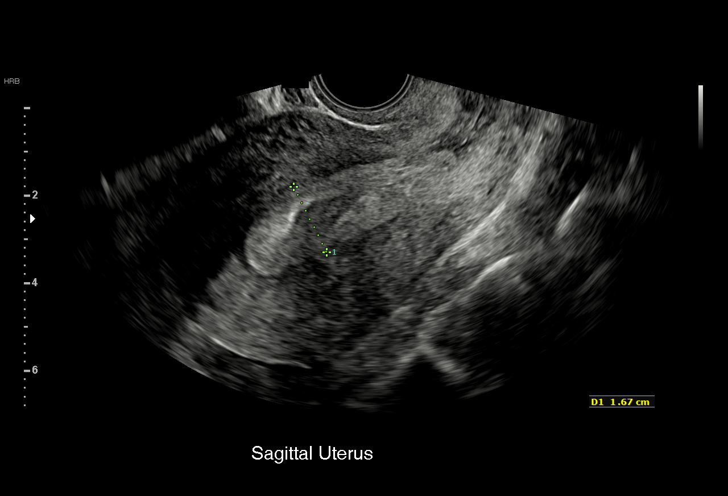

[15 of 28 positions shown; findings below may reference images not displayed]

FINDINGS: Intrauterine gestational sac: Absent

Yolk sac:  N/A

Embryo:  N/A

Cardiac Activity: N/A

Heart Rate: N/A  bpm

Subchorionic hemorrhage:  N/A

Maternal uterus/adnexae:

Trophoblastic reaction within uterus.

Small amount of mobile hypoechoic material within the endometrial
canal likely blood.

No full fluid collection or gestational sac identified.

Uterus otherwise normal appearance.

RIGHT ovary normal size and morphology 2.9 x 1.7 x 1.9 cm.

LEFT ovary normal size and morphology 3.2 x 1.5 x 2.5 cm.

No free pelvic fluid or adnexal masses.
IMPRESSION: No intrauterine gestation identified.

Suspected blood within endometrial canal.

Findings are compatible with pregnancy of unknown location.

Differential diagnosis includes early intrauterine pregnancy too
early to visualize, spontaneous abortion, and ectopic pregnancy.

Serial quantitative beta HCG and or followup ultrasound recommended
to definitively exclude ectopic pregnancy.

## 2019-11-28 IMAGING — DX DG CHEST 2V
2 series · 2 of 2 positions shown · non-contrast
Comparison: November 13, 2011

CLINICAL DATA: Shortness of breath and dysphagia

EXAM:
CHEST - 2 VIEW

[chest pa]
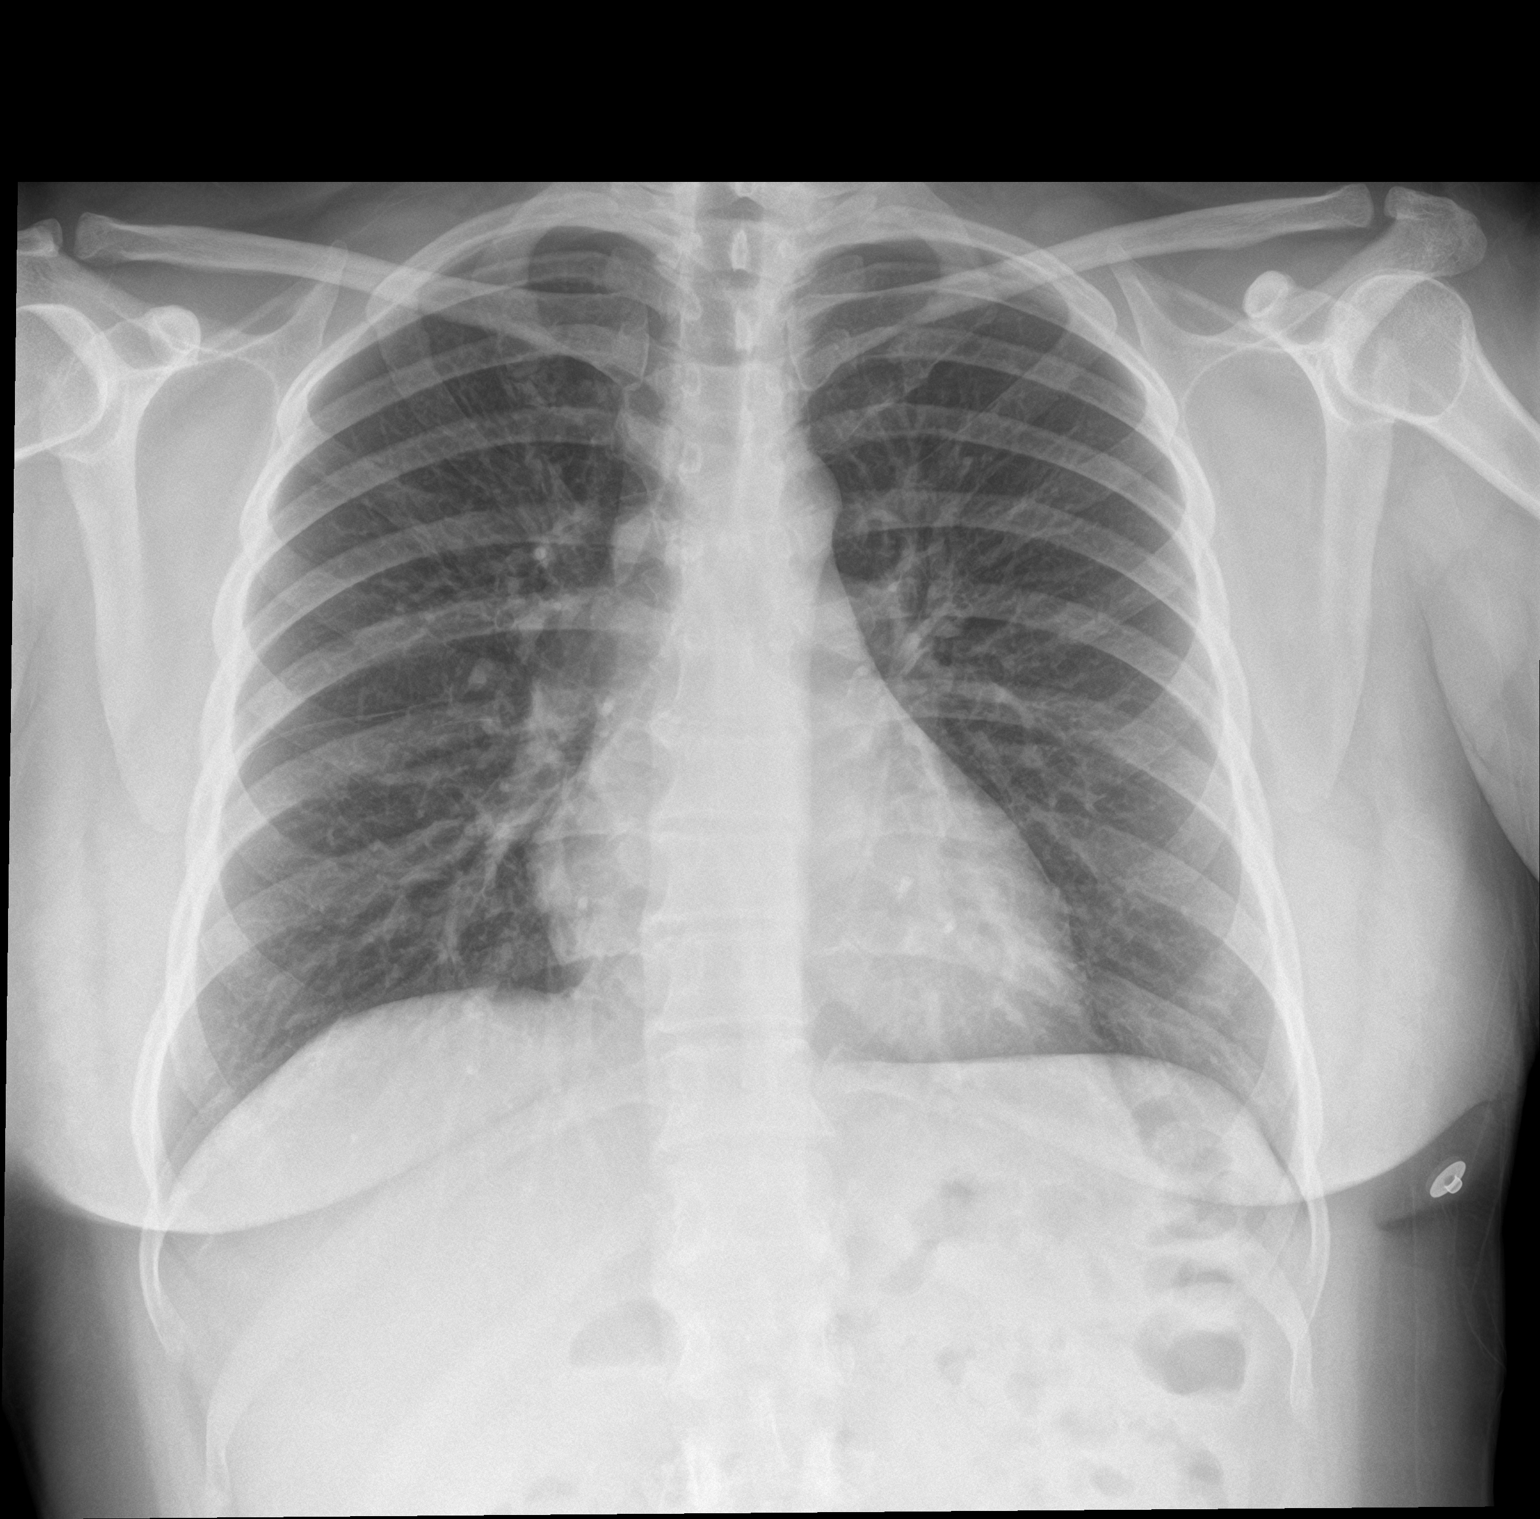

[chest lat]
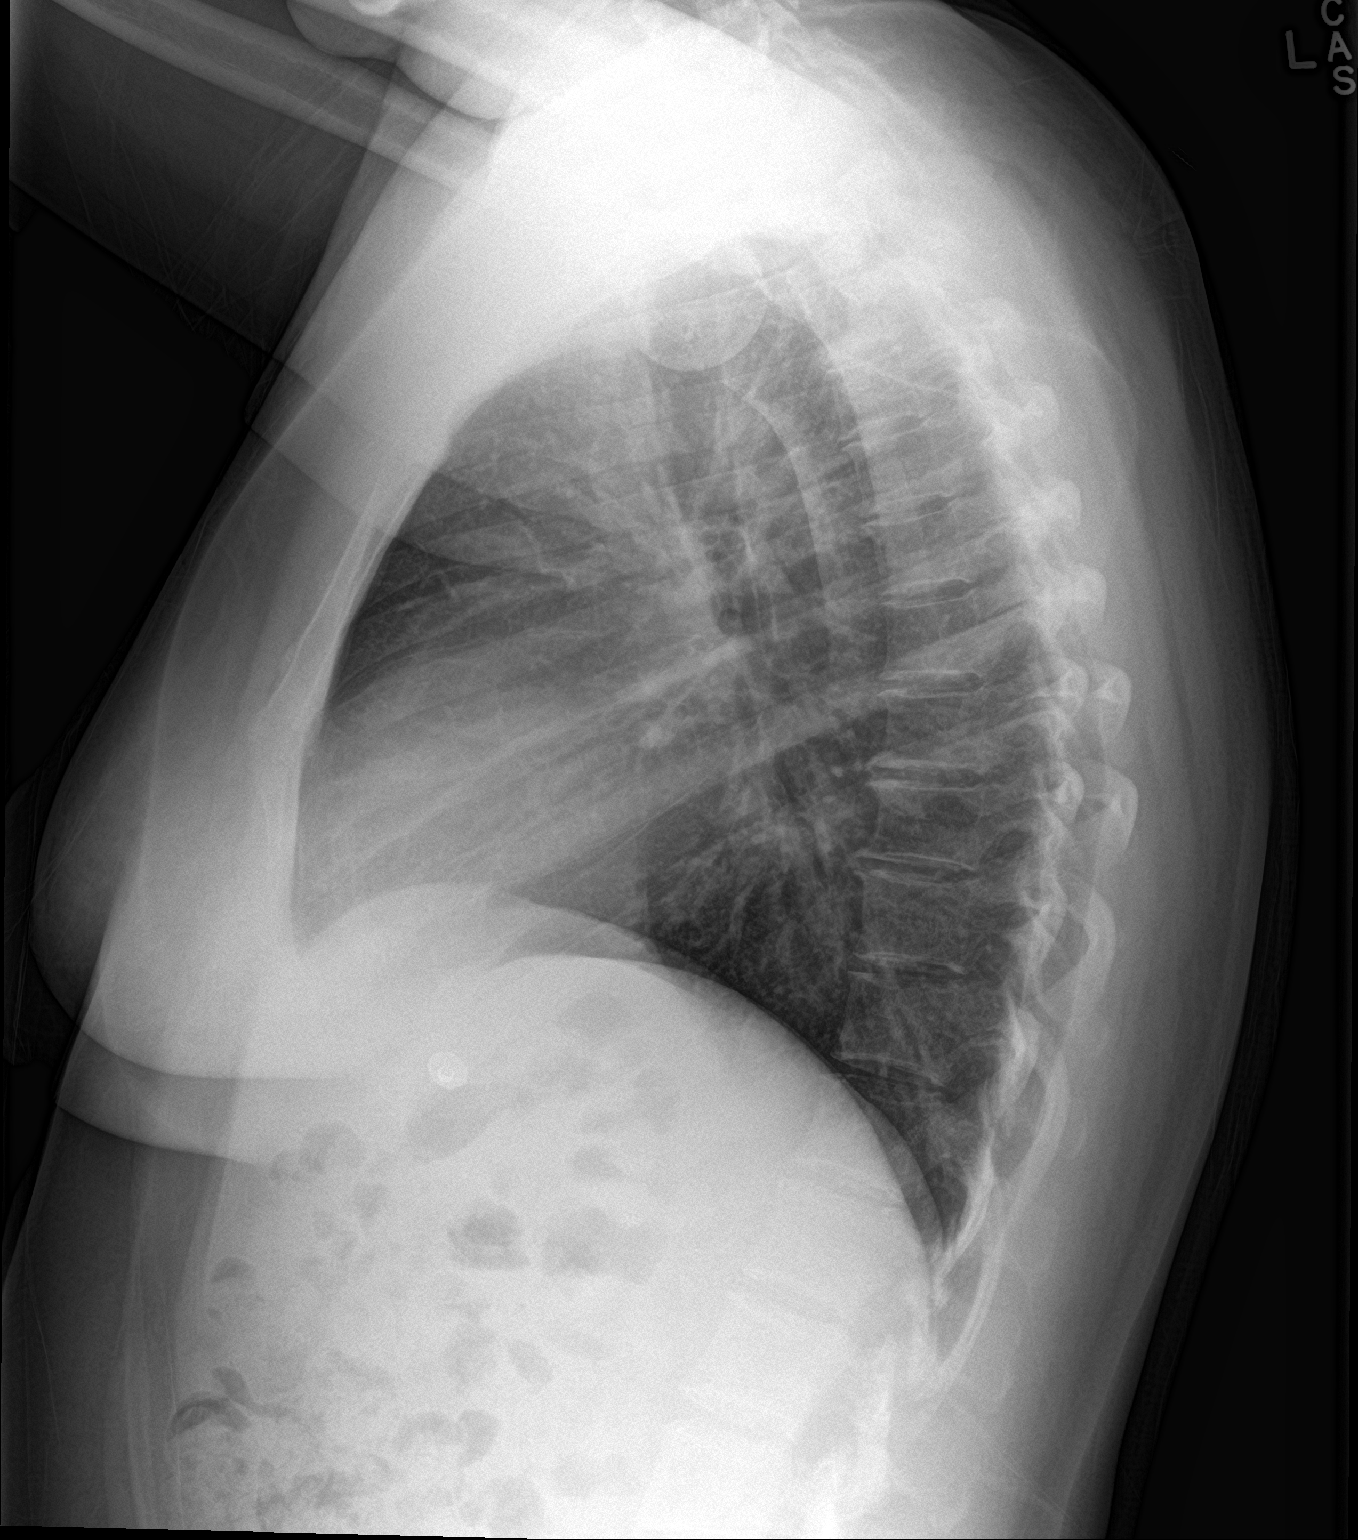

[2 of 2 positions shown; findings below may reference images not displayed]

FINDINGS: No edema or consolidation. The heart size and pulmonary vascularity
are normal. No adenopathy. No pneumothorax. No bone lesions.
IMPRESSION: No edema or consolidation.
